# Patient Record
Sex: Female | Born: 1990 | Race: White | Hispanic: No | Smoking: Current every day smoker
Health system: Southern US, Community
[De-identification: ages and names within clinical notes are randomized; demographics above are authoritative.]

## PROBLEM LIST (undated history)

## (undated) DIAGNOSIS — Z8673 Personal history of transient ischemic attack (TIA), and cerebral infarction without residual deficits: Secondary | ICD-10-CM

## (undated) DIAGNOSIS — I10 Essential (primary) hypertension: Secondary | ICD-10-CM

## (undated) DIAGNOSIS — K219 Gastro-esophageal reflux disease without esophagitis: Secondary | ICD-10-CM

## (undated) DIAGNOSIS — K279 Peptic ulcer, site unspecified, unspecified as acute or chronic, without hemorrhage or perforation: Secondary | ICD-10-CM

## (undated) DIAGNOSIS — F32A Depression, unspecified: Secondary | ICD-10-CM

## (undated) DIAGNOSIS — E559 Vitamin D deficiency, unspecified: Secondary | ICD-10-CM

## (undated) DIAGNOSIS — F419 Anxiety disorder, unspecified: Secondary | ICD-10-CM

## (undated) DIAGNOSIS — N390 Urinary tract infection, site not specified: Secondary | ICD-10-CM

## (undated) DIAGNOSIS — E7800 Pure hypercholesterolemia, unspecified: Secondary | ICD-10-CM

## (undated) DIAGNOSIS — G43909 Migraine, unspecified, not intractable, without status migrainosus: Secondary | ICD-10-CM

## (undated) DIAGNOSIS — R7303 Prediabetes: Secondary | ICD-10-CM

## (undated) DIAGNOSIS — F329 Major depressive disorder, single episode, unspecified: Secondary | ICD-10-CM

## (undated) DIAGNOSIS — R002 Palpitations: Secondary | ICD-10-CM

## (undated) DIAGNOSIS — E282 Polycystic ovarian syndrome: Secondary | ICD-10-CM

## (undated) DIAGNOSIS — K259 Gastric ulcer, unspecified as acute or chronic, without hemorrhage or perforation: Secondary | ICD-10-CM

## (undated) HISTORY — DX: Prediabetes: R73.03

## (undated) HISTORY — PX: TUBAL LIGATION: SHX77

## (undated) HISTORY — DX: Palpitations: R00.2

## (undated) HISTORY — PX: CHOLECYSTECTOMY: SHX55

## (undated) HISTORY — DX: Vitamin D deficiency, unspecified: E55.9

## (undated) HISTORY — DX: Urinary tract infection, site not specified: N39.0

## (undated) HISTORY — DX: Personal history of transient ischemic attack (TIA), and cerebral infarction without residual deficits: Z86.73

## (undated) HISTORY — PX: NO PAST SURGERIES: SHX2092

## (undated) HISTORY — DX: Peptic ulcer, site unspecified, unspecified as acute or chronic, without hemorrhage or perforation: K27.9

## (undated) HISTORY — DX: Pure hypercholesterolemia, unspecified: E78.00

## (undated) HISTORY — DX: Depression, unspecified: F32.A

## (undated) HISTORY — DX: Anxiety disorder, unspecified: F41.9

## (undated) HISTORY — PX: BLADDER REPAIR: SHX6721

---
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
## 2010-05-25 LAB — BASIC METABOLIC PANEL
BUN: 7 mg/dL (ref 6–23)
CO2: 30 mEq/L (ref 19–32)
Calcium: 9.1 mg/dL (ref 8.4–10.5)
Chloride: 104 mEq/L (ref 96–112)
Creatinine, Ser: 0.82 mg/dL (ref 0.4–1.2)
GFR calc Af Amer: >60 mL/min (ref >60)
GFR calc non Af Amer: >60 mL/min (ref >60)
Glucose, Bld: 92 mg/dL (ref 70–99)
Potassium: 3.9 mEq/L (ref 3.5–5.1)
Sodium: 142 mEq/L (ref 135–145)

## 2010-05-25 LAB — DIFFERENTIAL
Basophils Absolute: 0 10*3/uL (ref 0.0–0.1)
Basophils Relative: 0 % (ref 0–1)
Eosinophils Absolute: 0 10*3/uL (ref 0.0–0.7)
Eosinophils Relative: 1 % (ref 0–5)
Lymphocytes Relative: 22 % (ref 12–46)
Lymphs Abs: 2.2 10*3/uL (ref 0.7–4.0)
Monocytes Absolute: 0.4 10*3/uL (ref 0.1–1.0)
Monocytes Relative: 4 % (ref 3–12)
Neutro Abs: 7.3 10*3/uL (ref 1.7–7.7)
Neutrophils Relative %: 73 % (ref 43–77)

## 2010-05-25 LAB — URINALYSIS, ROUTINE W REFLEX MICROSCOPIC
Glucose, UA: NEGATIVE mg/dL
Hgb urine dipstick: NEGATIVE
Ketones, ur: 15 mg/dL — AB
Nitrite: NEGATIVE
Protein, ur: 100 mg/dL — AB
Specific Gravity, Urine: 1.04 — ABNORMAL HIGH (ref 1.005–1.030)
Urobilinogen, UA: 1 mg/dL (ref 0.0–1.0)
pH: 7 (ref 5.0–8.0)

## 2010-05-25 LAB — CBC
HCT: 35.1 % — ABNORMAL LOW (ref 36.0–46.0)
Hemoglobin: 11.7 g/dL — ABNORMAL LOW (ref 12.0–15.0)
MCHC: 33.2 g/dL (ref 30.0–36.0)
MCV: 87 fL (ref 78.0–100.0)
Platelets: 225 10*3/uL (ref 150–400)
RBC: 4.04 MIL/uL (ref 3.87–5.11)
RDW: 12.6 % (ref 11.5–15.5)
WBC: 10 10*3/uL (ref 4.0–10.5)

## 2010-05-25 LAB — HEPATIC FUNCTION PANEL
ALT: 40 U/L — ABNORMAL HIGH (ref 0–35)
AST: 72 U/L — ABNORMAL HIGH (ref 0–37)
Albumin: 4 g/dL (ref 3.5–5.2)
Alkaline Phosphatase: 91 U/L (ref 39–117)
Bilirubin, Direct: 0.1 mg/dL (ref 0.0–0.3)
Indirect Bilirubin: 0.5 mg/dL (ref 0.3–0.9)
Total Bilirubin: 0.6 mg/dL (ref 0.3–1.2)
Total Protein: 7 g/dL (ref 6.0–8.3)

## 2010-05-25 LAB — POCT PREGNANCY, URINE: Preg Test, Ur: NEGATIVE

## 2010-05-25 LAB — LIPASE, BLOOD: Lipase: 18 U/L (ref 11–59)

## 2010-05-25 LAB — URINE MICROSCOPIC-ADD ON

## 2010-05-26 LAB — CBC
HCT: 22.2 % — ABNORMAL LOW (ref 36.0–46.0)
HCT: 22.3 % — ABNORMAL LOW (ref 36.0–46.0)
HCT: 24.6 % — ABNORMAL LOW (ref 36.0–46.0)
HCT: 28.1 % — ABNORMAL LOW (ref 36.0–46.0)
HCT: 38.6 % (ref 36.0–46.0)
Hemoglobin: 13.1 g/dL (ref 12.0–15.0)
Hemoglobin: 7.5 g/dL — CL (ref 12.0–15.0)
Hemoglobin: 7.7 g/dL — CL (ref 12.0–15.0)
Hemoglobin: 8.3 g/dL — ABNORMAL LOW (ref 12.0–15.0)
Hemoglobin: 9.7 g/dL — ABNORMAL LOW (ref 12.0–15.0)
MCHC: 33.6 g/dL (ref 30.0–36.0)
MCHC: 33.9 g/dL (ref 30.0–36.0)
MCHC: 34 g/dL (ref 30.0–36.0)
MCHC: 34.3 g/dL (ref 30.0–36.0)
MCHC: 34.5 g/dL (ref 30.0–36.0)
MCV: 91.1 fL (ref 78.0–100.0)
MCV: 91.5 fL (ref 78.0–100.0)
MCV: 91.5 fL (ref 78.0–100.0)
MCV: 91.6 fL (ref 78.0–100.0)
MCV: 92.3 fL (ref 78.0–100.0)
Platelets: 145 10*3/uL — ABNORMAL LOW (ref 150–400)
Platelets: 157 10*3/uL (ref 150–400)
Platelets: 161 10*3/uL (ref 150–400)
Platelets: 178 10*3/uL (ref 150–400)
Platelets: 204 10*3/uL (ref 150–400)
RBC: 2.43 MIL/uL — ABNORMAL LOW (ref 3.87–5.11)
RBC: 2.44 MIL/uL — ABNORMAL LOW (ref 3.87–5.11)
RBC: 2.69 MIL/uL — ABNORMAL LOW (ref 3.87–5.11)
RBC: 3.05 MIL/uL — ABNORMAL LOW (ref 3.87–5.11)
RBC: 4.23 MIL/uL (ref 3.87–5.11)
RDW: 12.9 % (ref 11.5–15.5)
RDW: 13.2 % (ref 11.5–15.5)
RDW: 13.3 % (ref 11.5–15.5)
RDW: 13.3 % (ref 11.5–15.5)
RDW: 13.4 % (ref 11.5–15.5)
WBC: 10.2 10*3/uL (ref 4.0–10.5)
WBC: 10.8 10*3/uL — ABNORMAL HIGH (ref 4.0–10.5)
WBC: 18 10*3/uL — ABNORMAL HIGH (ref 4.0–10.5)
WBC: 25.5 10*3/uL — ABNORMAL HIGH (ref 4.0–10.5)
WBC: 9.2 10*3/uL (ref 4.0–10.5)

## 2010-05-26 LAB — COMPREHENSIVE METABOLIC PANEL
ALT: 24 U/L (ref 0–35)
ALT: 27 U/L (ref 0–35)
ALT: 32 U/L (ref 0–35)
ALT: 42 U/L — ABNORMAL HIGH (ref 0–35)
AST: 28 U/L (ref 0–37)
AST: 32 U/L (ref 0–37)
AST: 53 U/L — ABNORMAL HIGH (ref 0–37)
AST: 75 U/L — ABNORMAL HIGH (ref 0–37)
Albumin: 2.2 g/dL — ABNORMAL LOW (ref 3.5–5.2)
Albumin: 2.3 g/dL — ABNORMAL LOW (ref 3.5–5.2)
Albumin: 2.4 g/dL — ABNORMAL LOW (ref 3.5–5.2)
Albumin: 3.2 g/dL — ABNORMAL LOW (ref 3.5–5.2)
Alkaline Phosphatase: 115 U/L (ref 39–117)
Alkaline Phosphatase: 121 U/L — ABNORMAL HIGH (ref 39–117)
Alkaline Phosphatase: 144 U/L — ABNORMAL HIGH (ref 39–117)
Alkaline Phosphatase: 167 U/L — ABNORMAL HIGH (ref 39–117)
BUN: 2 mg/dL — ABNORMAL LOW (ref 6–23)
BUN: 3 mg/dL — ABNORMAL LOW (ref 6–23)
BUN: 3 mg/dL — ABNORMAL LOW (ref 6–23)
BUN: 8 mg/dL (ref 6–23)
CO2: 21 mEq/L (ref 19–32)
CO2: 27 mEq/L (ref 19–32)
CO2: 27 mEq/L (ref 19–32)
CO2: 27 mEq/L (ref 19–32)
Calcium: 6.2 mg/dL — CL (ref 8.4–10.5)
Calcium: 6.5 mg/dL — ABNORMAL LOW (ref 8.4–10.5)
Calcium: 7.6 mg/dL — ABNORMAL LOW (ref 8.4–10.5)
Calcium: 8.7 mg/dL (ref 8.4–10.5)
Chloride: 103 mEq/L (ref 96–112)
Chloride: 103 mEq/L (ref 96–112)
Chloride: 105 mEq/L (ref 96–112)
Chloride: 107 mEq/L (ref 96–112)
Creatinine, Ser: 0.66 mg/dL (ref 0.4–1.2)
Creatinine, Ser: 0.67 mg/dL (ref 0.4–1.2)
Creatinine, Ser: 0.67 mg/dL (ref 0.4–1.2)
Creatinine, Ser: 0.7 mg/dL (ref 0.4–1.2)
GFR calc Af Amer: >60 mL/min (ref >60)
GFR calc Af Amer: >60 mL/min (ref >60)
GFR calc Af Amer: >60 mL/min (ref >60)
GFR calc Af Amer: >60 mL/min (ref >60)
GFR calc non Af Amer: >60 mL/min (ref >60)
GFR calc non Af Amer: >60 mL/min (ref >60)
GFR calc non Af Amer: >60 mL/min (ref >60)
GFR calc non Af Amer: >60 mL/min (ref >60)
Glucose, Bld: 75 mg/dL (ref 70–99)
Glucose, Bld: 79 mg/dL (ref 70–99)
Glucose, Bld: 86 mg/dL (ref 70–99)
Glucose, Bld: 99 mg/dL (ref 70–99)
Potassium: 3.7 mEq/L (ref 3.5–5.1)
Potassium: 3.7 mEq/L (ref 3.5–5.1)
Potassium: 3.8 mEq/L (ref 3.5–5.1)
Potassium: 4.1 mEq/L (ref 3.5–5.1)
Sodium: 136 mEq/L (ref 135–145)
Sodium: 136 mEq/L (ref 135–145)
Sodium: 137 mEq/L (ref 135–145)
Sodium: 139 mEq/L (ref 135–145)
Total Bilirubin: 0.3 mg/dL (ref 0.3–1.2)
Total Bilirubin: 0.4 mg/dL (ref 0.3–1.2)
Total Bilirubin: 0.4 mg/dL (ref 0.3–1.2)
Total Bilirubin: 0.6 mg/dL (ref 0.3–1.2)
Total Protein: 4.6 g/dL — ABNORMAL LOW (ref 6.0–8.3)
Total Protein: 5.2 g/dL — ABNORMAL LOW (ref 6.0–8.3)
Total Protein: 5.3 g/dL — ABNORMAL LOW (ref 6.0–8.3)
Total Protein: 6.7 g/dL (ref 6.0–8.3)

## 2010-05-26 LAB — MAGNESIUM: Magnesium: 5.2 mg/dL — ABNORMAL HIGH (ref 1.5–2.5)

## 2010-05-26 LAB — URINALYSIS, ROUTINE W REFLEX MICROSCOPIC
Bilirubin Urine: NEGATIVE
Glucose, UA: NEGATIVE mg/dL
Hgb urine dipstick: NEGATIVE
Ketones, ur: NEGATIVE mg/dL
Nitrite: NEGATIVE
Protein, ur: NEGATIVE mg/dL
Specific Gravity, Urine: 1.025 (ref 1.005–1.030)
Urobilinogen, UA: 0.2 mg/dL (ref 0.0–1.0)
pH: 5.5 (ref 5.0–8.0)

## 2010-05-26 LAB — URIC ACID
Uric Acid, Serum: 5 mg/dL (ref 2.4–7.0)
Uric Acid, Serum: 5.4 mg/dL (ref 2.4–7.0)

## 2010-05-26 LAB — RPR: RPR Ser Ql: NONREACTIVE

## 2010-05-26 LAB — LACTATE DEHYDROGENASE: LDH: 249 U/L (ref 94–250)

## 2010-05-27 LAB — URINE CULTURE: Colony Count: 65000

## 2010-05-27 LAB — COMPREHENSIVE METABOLIC PANEL
ALT: 35 U/L (ref 0–35)
AST: 30 U/L (ref 0–37)
Albumin: 3 g/dL — ABNORMAL LOW (ref 3.5–5.2)
Alkaline Phosphatase: 141 U/L — ABNORMAL HIGH (ref 39–117)
BUN: 8 mg/dL (ref 6–23)
CO2: 24 mEq/L (ref 19–32)
Calcium: 8.5 mg/dL (ref 8.4–10.5)
Chloride: 104 mEq/L (ref 96–112)
Creatinine, Ser: 0.73 mg/dL (ref 0.4–1.2)
GFR calc Af Amer: >60 mL/min (ref >60)
GFR calc non Af Amer: >60 mL/min (ref >60)
Glucose, Bld: 87 mg/dL (ref 70–99)
Potassium: 3.9 mEq/L (ref 3.5–5.1)
Sodium: 137 mEq/L (ref 135–145)
Total Bilirubin: 0.4 mg/dL (ref 0.3–1.2)
Total Protein: 6.3 g/dL (ref 6.0–8.3)

## 2010-05-27 LAB — URINALYSIS, ROUTINE W REFLEX MICROSCOPIC
Bilirubin Urine: NEGATIVE
Glucose, UA: NEGATIVE mg/dL
Ketones, ur: NEGATIVE mg/dL
Nitrite: NEGATIVE
Protein, ur: NEGATIVE mg/dL
Specific Gravity, Urine: 1.025 (ref 1.005–1.030)
Urobilinogen, UA: 0.2 mg/dL (ref 0.0–1.0)
pH: 5.5 (ref 5.0–8.0)

## 2010-05-27 LAB — CBC
HCT: 35.9 % — ABNORMAL LOW (ref 36.0–46.0)
HCT: 38.6 % (ref 36.0–46.0)
Hemoglobin: 12.1 g/dL (ref 12.0–15.0)
Hemoglobin: 13 g/dL (ref 12.0–15.0)
MCHC: 33.6 g/dL (ref 30.0–36.0)
MCHC: 33.8 g/dL (ref 30.0–36.0)
MCV: 90.9 fL (ref 78.0–100.0)
MCV: 91.3 fL (ref 78.0–100.0)
Platelets: 189 10*3/uL (ref 150–400)
Platelets: 213 10*3/uL (ref 150–400)
RBC: 3.94 MIL/uL (ref 3.87–5.11)
RBC: 4.25 MIL/uL (ref 3.87–5.11)
RDW: 12.8 % (ref 11.5–15.5)
RDW: 13 % (ref 11.5–15.5)
WBC: 13.3 10*3/uL — ABNORMAL HIGH (ref 4.0–10.5)
WBC: 16.9 10*3/uL — ABNORMAL HIGH (ref 4.0–10.5)

## 2010-05-27 LAB — URINE MICROSCOPIC-ADD ON: RBC / HPF: NONE SEEN RBC/hpf (ref <3)

## 2010-05-27 LAB — RPR: RPR Ser Ql: NONREACTIVE

## 2010-08-14 ENCOUNTER — Emergency Department (HOSPITAL_COMMUNITY)
Admission: EM | Admit: 2010-08-14 | Discharge: 2010-08-14 | Disposition: A | Discharge status: Home / Self Care | Payer: Self-pay | Attending: Emergency Medicine | Admitting: Emergency Medicine

## 2010-08-14 DIAGNOSIS — F410 Panic disorder [episodic paroxysmal anxiety] without agoraphobia: Secondary | ICD-10-CM | POA: Insufficient documentation

## 2010-08-14 DIAGNOSIS — R064 Hyperventilation: Secondary | ICD-10-CM | POA: Insufficient documentation

## 2010-11-15 LAB — POCT PREGNANCY, URINE
Operator id: 173591
Preg Test, Ur: NEGATIVE

## 2010-11-15 LAB — URINE MICROSCOPIC-ADD ON

## 2010-11-15 LAB — URINALYSIS, ROUTINE W REFLEX MICROSCOPIC
Glucose, UA: NEGATIVE
Ketones, ur: 15 — AB
Nitrite: POSITIVE — AB
Protein, ur: 100 — AB
Specific Gravity, Urine: 1.03
Urobilinogen, UA: 1
pH: 5.5

## 2012-07-01 ENCOUNTER — Other Ambulatory Visit: Payer: Self-pay | Admitting: Nurse Practitioner

## 2012-07-28 ENCOUNTER — Other Ambulatory Visit: Payer: Self-pay | Admitting: Family Medicine

## 2012-07-28 DIAGNOSIS — N912 Amenorrhea, unspecified: Secondary | ICD-10-CM

## 2012-07-30 ENCOUNTER — Ambulatory Visit
Admission: RE | Admit: 2012-07-30 | Discharge: 2012-07-30 | Disposition: A | Discharge status: Home / Self Care | Payer: BC Managed Care – PPO | Source: Ambulatory Visit | Attending: Family Medicine | Admitting: Family Medicine

## 2012-07-30 DIAGNOSIS — N912 Amenorrhea, unspecified: Secondary | ICD-10-CM

## 2012-10-04 ENCOUNTER — Emergency Department (HOSPITAL_COMMUNITY)
Admission: EM | Admit: 2012-10-04 | Discharge: 2012-10-04 | Disposition: A | Discharge status: Home / Self Care | Payer: BC Managed Care – PPO | Attending: Emergency Medicine | Admitting: Emergency Medicine

## 2012-10-04 ENCOUNTER — Encounter (HOSPITAL_COMMUNITY): Payer: Self-pay | Admitting: *Deleted

## 2012-10-04 ENCOUNTER — Emergency Department (HOSPITAL_COMMUNITY): Payer: BC Managed Care – PPO

## 2012-10-04 DIAGNOSIS — K8020 Calculus of gallbladder without cholecystitis without obstruction: Secondary | ICD-10-CM | POA: Insufficient documentation

## 2012-10-04 DIAGNOSIS — Z79899 Other long term (current) drug therapy: Secondary | ICD-10-CM | POA: Insufficient documentation

## 2012-10-04 DIAGNOSIS — Z3202 Encounter for pregnancy test, result negative: Secondary | ICD-10-CM | POA: Insufficient documentation

## 2012-10-04 DIAGNOSIS — N12 Tubulo-interstitial nephritis, not specified as acute or chronic: Secondary | ICD-10-CM | POA: Insufficient documentation

## 2012-10-04 LAB — URINALYSIS, ROUTINE W REFLEX MICROSCOPIC
Bilirubin Urine: NEGATIVE
Glucose, UA: NEGATIVE mg/dL
Ketones, ur: 15 mg/dL — AB
Nitrite: POSITIVE — AB
Protein, ur: 30 mg/dL — AB
Specific Gravity, Urine: 1.011 (ref 1.005–1.030)
Urobilinogen, UA: 0.2 mg/dL (ref 0.0–1.0)
pH: 6 (ref 5.0–8.0)

## 2012-10-04 LAB — URINE MICROSCOPIC-ADD ON

## 2012-10-04 LAB — CBC WITH DIFFERENTIAL/PLATELET
Basophils Absolute: 0 10*3/uL (ref 0.0–0.1)
Basophils Relative: 0 % (ref 0–1)
Eosinophils Absolute: 0.1 10*3/uL (ref 0.0–0.7)
Eosinophils Relative: 1 % (ref 0–5)
HCT: 32.4 % — ABNORMAL LOW (ref 36.0–46.0)
Hemoglobin: 10.8 g/dL — ABNORMAL LOW (ref 12.0–15.0)
Lymphocytes Relative: 15 % (ref 12–46)
Lymphs Abs: 1.7 10*3/uL (ref 0.7–4.0)
MCH: 27.6 pg (ref 26.0–34.0)
MCHC: 33.3 g/dL (ref 30.0–36.0)
MCV: 82.9 fL (ref 78.0–100.0)
Monocytes Absolute: 1 10*3/uL (ref 0.1–1.0)
Monocytes Relative: 9 % (ref 3–12)
Neutro Abs: 8.3 10*3/uL — ABNORMAL HIGH (ref 1.7–7.7)
Neutrophils Relative %: 75 % (ref 43–77)
Platelets: 252 10*3/uL (ref 150–400)
RBC: 3.91 MIL/uL (ref 3.87–5.11)
RDW: 13.8 % (ref 11.5–15.5)
WBC: 11.1 10*3/uL — ABNORMAL HIGH (ref 4.0–10.5)

## 2012-10-04 LAB — COMPREHENSIVE METABOLIC PANEL
ALT: 13 U/L (ref 0–35)
AST: 13 U/L (ref 0–37)
Albumin: 3.1 g/dL — ABNORMAL LOW (ref 3.5–5.2)
Alkaline Phosphatase: 66 U/L (ref 39–117)
BUN: 8 mg/dL (ref 6–23)
CO2: 25 mEq/L (ref 19–32)
Calcium: 8.5 mg/dL (ref 8.4–10.5)
Chloride: 102 mEq/L (ref 96–112)
Creatinine, Ser: 0.76 mg/dL (ref 0.50–1.10)
GFR calc Af Amer: >90 mL/min (ref >90)
GFR calc non Af Amer: >90 mL/min (ref >90)
Glucose, Bld: 135 mg/dL — ABNORMAL HIGH (ref 70–99)
Potassium: 2.8 mEq/L — ABNORMAL LOW (ref 3.5–5.1)
Sodium: 138 mEq/L (ref 135–145)
Total Bilirubin: 0.3 mg/dL (ref 0.3–1.2)
Total Protein: 6.8 g/dL (ref 6.0–8.3)

## 2012-10-04 LAB — LIPASE, BLOOD: Lipase: 16 U/L (ref 11–59)

## 2012-10-04 LAB — POCT PREGNANCY, URINE
Preg Test, Ur: NEGATIVE
Preg Test, Ur: NEGATIVE

## 2012-10-04 MED ORDER — ONDANSETRON 4 MG PO TBDP
8.0000 mg | ORAL_TABLET | Freq: Once | ORAL | Status: AC
  Administered 2012-10-04: 8 mg via ORAL

## 2012-10-04 MED ORDER — ONDANSETRON 4 MG PO TBDP
ORAL_TABLET | ORAL | Status: AC
  Filled 2012-10-04: qty 2

## 2012-10-04 MED ORDER — CIPROFLOXACIN HCL 500 MG PO TABS: 500.0000 mg | ORAL_TABLET | Freq: Two times a day (BID) | ORAL | Status: DC

## 2012-10-04 MED ORDER — POTASSIUM CHLORIDE 20 MEQ/15ML (10%) PO LIQD
40.0000 meq | Freq: Once | ORAL | Status: AC
  Administered 2012-10-04: 40 meq via ORAL
  Filled 2012-10-04: qty 30

## 2012-10-04 MED ORDER — SODIUM CHLORIDE 0.9 % IV BOLUS (SEPSIS)
1000.0000 mL | Freq: Once | INTRAVENOUS | Status: AC
  Administered 2012-10-04: 1000 mL via INTRAVENOUS

## 2012-10-04 NOTE — ED Provider Notes (Signed)
CSN: [id number]     Arrival date & time 10/04/12  1455 History     First MD Initiated Contact with Patient 10/04/12 1524     Chief Complaint  Patient presents with  . Abdominal Pain   (Consider location/radiation/quality/duration/timing/severity/associated sxs/prior Treatment) Patient is a 22 y.o. female presenting with abdominal pain. The history is provided by the patient.  Abdominal Pain Pain location:  RUQ Pain quality: sharp   Pain quality: not burning, not cramping and no pressure   Pain radiates to:  R flank Pain severity:  Moderate Onset quality:  Gradual Duration:  7 days Timing:  Constant Progression:  Unchanged Chronicity:  New Context: not alcohol use   Relieved by:  NSAIDs Associated symptoms: dysuria, fever, nausea and vomiting   Associated symptoms: no chest pain, no diarrhea and no shortness of breath    This is a 22 year old female with no significant past medical history who presents with right-sided abdominal pain. The patient also reports a fever to 103. She describes the pain as sharp and radiating to her right flank. It is constant.  There is no association with food. Patient has never had pain like this before. The patient does endorse urinary frequency and dysuria. She states her last period was last week. She endorses decreased by mouth. Currently her pain is 3/10. History reviewed. No pertinent past medical history. History reviewed. No pertinent past surgical history. History reviewed. No pertinent family history. History  Substance Use Topics  . Smoking status: Never Smoker   . Smokeless tobacco: Not on file  . Alcohol Use: No   OB History   Grav Para Term Preterm Abortions TAB SAB Ect Mult Living                 Review of Systems  Constitutional: Positive for fever.  HENT: Negative.   Respiratory: Negative for chest tightness and shortness of breath.   Cardiovascular: Negative for chest pain.  Gastrointestinal: Positive for nausea, vomiting  and abdominal pain. Negative for diarrhea.  Genitourinary: Positive for dysuria and urgency.  Musculoskeletal: Positive for back pain.  Skin: Negative for rash.  Neurological: Positive for light-headedness. Negative for headaches.  Psychiatric/Behavioral: Negative.   All other systems reviewed and are negative.    Allergies  Review of patient's allergies indicates no known allergies.  Home Medications   Current Outpatient Rx  Name  Route  Sig  Dispense  Refill  . Calcium Carbonate Antacid (TUMS PO)   Oral   Take 1 tablet by mouth daily as needed (for acid reflux).         . ferrous sulfate 325 (65 FE) MG tablet   Oral   Take 325 mg by mouth daily with breakfast.         . LORazepam (ATIVAN) 0.5 MG tablet   Oral   Take 0.5 mg by mouth 2 (two) times daily as needed for anxiety.         . norelgestromin-ethinyl estradiol (ORTHO EVRA) 150-35 MCG/24HR transdermal patch   Transdermal   Place 1 patch onto the skin once a week.         Marland Kitchen omeprazole (PRILOSEC) 20 MG capsule   Oral   Take 20 mg by mouth daily.         . promethazine (PHENERGAN) 25 MG tablet   Oral   Take 25 mg by mouth every 6 (six) hours as needed for nausea.         Marland Kitchen venlafaxine XR (EFFEXOR-XR) 75  MG 24 hr capsule   Oral   Take 75 mg by mouth daily.         . ciprofloxacin (CIPRO) 500 MG tablet   Oral   Take 1 tablet (500 mg total) by mouth every 12 (twelve) hours.   28 tablet   0    BP 147/81  Pulse 110  Temp(Src) 100.3 F (37.9 C) (Oral)  Resp 18  SpO2 97%  LMP 09/27/2012 Physical Exam  Nursing note and vitals reviewed. Constitutional: She is oriented to person, place, and time. She appears well-developed and well-nourished.  HENT:  Head: Normocephalic and atraumatic.  Eyes: Pupils are equal, round, and reactive to light.  Neck: Neck supple.  Cardiovascular: Normal rate, regular rhythm and normal heart sounds.   Pulmonary/Chest: Effort normal. No respiratory distress. She  has no wheezes.  Abdominal: Soft. Bowel sounds are normal. There is tenderness in the right upper quadrant. There is positive Murphy's sign. There is no rebound and no guarding.  Neurological: She is alert and oriented to person, place, and time.  Skin: Skin is warm and dry.  Psychiatric: She has a normal mood and affect.    ED Course   Medications  ondansetron (ZOFRAN-ODT) disintegrating tablet 8 mg (8 mg Oral Given 10/04/12 1521)  sodium chloride 0.9 % bolus 1,000 mL (0 mL Intravenous Stopped 10/04/12 1819)  potassium chloride 20 MEQ/15ML (10%) liquid 40 mEq (40 mEq Oral Given 10/04/12 1833)    Procedures (including critical care time)  Labs Reviewed  COMPREHENSIVE METABOLIC PANEL - Abnormal; Notable for the following:    Potassium 2.8 (*)    Glucose, Bld 135 (*)    Albumin 3.1 (*)    All other components within normal limits  CBC WITH DIFFERENTIAL - Abnormal; Notable for the following:    WBC 11.1 (*)    Hemoglobin 10.8 (*)    HCT 32.4 (*)    Neutro Abs 8.3 (*)    All other components within normal limits  URINALYSIS, ROUTINE W REFLEX MICROSCOPIC - Abnormal; Notable for the following:    APPearance CLOUDY (*)    Hgb urine dipstick MODERATE (*)    Ketones, ur 15 (*)    Protein, ur 30 (*)    Nitrite POSITIVE (*)    Leukocytes, UA MODERATE (*)    All other components within normal limits  URINE MICROSCOPIC-ADD ON - Abnormal; Notable for the following:    Squamous Epithelial / LPF MANY (*)    Bacteria, UA MANY (*)    All other components within normal limits  URINE CULTURE  LIPASE, BLOOD  POCT PREGNANCY, URINE  POCT PREGNANCY, URINE   US Abdomen Complete  10/04/2012   *RADIOLOGY REPORT*  Abdominal ultrasound  History:  Abdominal pain and fever  Comparison:  November 30, 2008  Findings:  Gallbladder is visualized in multiple projections. Within the gallbladder, there are multiple echogenic foci which move and shadow consistent with gallstones.  Largest individual gallstone  measures 5 mm in length.  There is no gallbladder wall thickening or pericholecystic fluid collection.  There is no intrahepatic, common hepatic, or common bile duct dilatation.  The pancreas appears normal.  No focal liver lesions are identified.  Spleen is normal in size and homogeneous in echotexture.  Kidneys bilaterally appear normal. There is no ascites.  Aorta is nonaneurysmal.  Inferior vena cava appears normal.  Conclusion:  Cholelithiasis.  Study otherwise unremarkable.   Original Report Authenticated By: Bretta Bang, M.D.   1. Pyelonephritis  2. Gallstones     MDM  This is a 22 year old female who presents with complaints of right upper quadrant and right flank pain. She is nontoxic-appearing on exam. She was noted in triage to have a temperature 100.3 a pulse rate of 110. On exam she appears mildly dehydrated and has a positive Murphy sign.  Differential includes cholecystitis, pyelonephritis, hepatitis. Lab work is notable for mildly elevated white count of the left and 0.1. Patient's urine is nitrite positive with moderate leuks. Culture was sent. Patient was given IV Rocephin. A right upper quadrant ultrasound was obtained to rule out cholecystitis. Right upper quadrant ultrasound shows cholelithiasis without evidence of cholecystitis. The patient's symptoms are most likely related to the patient's UTI. Her presentation with fevers and flank pain are more consistent with pyelonephritis versus cystitis. The patient is able to tolerate by mouth and is generally well-appearing on exam.  I think she is appropriate for a trial of outpatient management on ciprofloxacin. She has a primary care physician and will followup with him on Monday. She was instructed to return if she has worsening fevers, worsening flank pain, or any other concerning symptoms.  Shon Baton, MD 10/04/12 2215

## 2012-10-04 NOTE — ED Notes (Signed)
Advised pt if she is unable to give urine sample, in and out cath will be necessary.  Pt given water to drink.

## 2012-10-04 NOTE — ED Notes (Signed)
Patient states that when she urinates it burns at times, feels like she is not finished and then in a short time she feels like she has to go again.  This has been going on for a few days.

## 2012-10-04 NOTE — ED Notes (Signed)
Advised pt of the need for urine sample.

## 2012-10-04 NOTE — ED Notes (Signed)
Pt reports right side pain x 1 week, having n/v, fever, burning pain with urination and now vomiting blood. Denies any diarrhea.

## 2012-10-04 NOTE — ED Notes (Signed)
Advised pt of the need for urine sample. 

## 2012-10-07 LAB — URINE CULTURE: Colony Count: >=100000

## 2012-10-09 NOTE — ED Notes (Signed)
+   Urine Culture- treated with appropriate medication per protocol MD. 

## 2012-11-22 ENCOUNTER — Encounter (HOSPITAL_COMMUNITY): Payer: Self-pay | Admitting: *Deleted

## 2012-11-22 ENCOUNTER — Emergency Department (HOSPITAL_COMMUNITY): Payer: BC Managed Care – PPO

## 2012-11-22 ENCOUNTER — Emergency Department (HOSPITAL_COMMUNITY)
Admission: EM | Admit: 2012-11-22 | Discharge: 2012-11-22 | Disposition: A | Discharge status: Home / Self Care | Payer: BC Managed Care – PPO | Attending: Emergency Medicine | Admitting: Emergency Medicine

## 2012-11-22 DIAGNOSIS — F411 Generalized anxiety disorder: Secondary | ICD-10-CM | POA: Insufficient documentation

## 2012-11-22 DIAGNOSIS — M549 Dorsalgia, unspecified: Secondary | ICD-10-CM

## 2012-11-22 DIAGNOSIS — Y939 Activity, unspecified: Secondary | ICD-10-CM | POA: Insufficient documentation

## 2012-11-22 DIAGNOSIS — Z3202 Encounter for pregnancy test, result negative: Secondary | ICD-10-CM | POA: Insufficient documentation

## 2012-11-22 DIAGNOSIS — Z79899 Other long term (current) drug therapy: Secondary | ICD-10-CM | POA: Insufficient documentation

## 2012-11-22 DIAGNOSIS — M542 Cervicalgia: Secondary | ICD-10-CM

## 2012-11-22 DIAGNOSIS — IMO0002 Reserved for concepts with insufficient information to code with codable children: Secondary | ICD-10-CM | POA: Insufficient documentation

## 2012-11-22 DIAGNOSIS — S0993XA Unspecified injury of face, initial encounter: Secondary | ICD-10-CM | POA: Insufficient documentation

## 2012-11-22 DIAGNOSIS — Y92410 Unspecified street and highway as the place of occurrence of the external cause: Secondary | ICD-10-CM | POA: Insufficient documentation

## 2012-11-22 LAB — URINALYSIS, ROUTINE W REFLEX MICROSCOPIC
Bilirubin Urine: NEGATIVE
Glucose, UA: NEGATIVE mg/dL
Hgb urine dipstick: NEGATIVE
Ketones, ur: 15 mg/dL — AB
Nitrite: NEGATIVE
Protein, ur: 30 mg/dL — AB
Specific Gravity, Urine: 1.025 (ref 1.005–1.030)
Urobilinogen, UA: 0.2 mg/dL (ref 0.0–1.0)
pH: 6 (ref 5.0–8.0)

## 2012-11-22 LAB — PREGNANCY, URINE: Preg Test, Ur: NEGATIVE

## 2012-11-22 LAB — URINE MICROSCOPIC-ADD ON

## 2012-11-22 MED ORDER — METHOCARBAMOL 500 MG PO TABS: 500.0000 mg | ORAL_TABLET | Freq: Two times a day (BID) | ORAL | Status: DC

## 2012-11-22 MED ORDER — NAPROXEN 500 MG PO TABS: 500.0000 mg | ORAL_TABLET | Freq: Two times a day (BID) | ORAL | Status: DC

## 2012-11-22 MED ORDER — METOCLOPRAMIDE HCL 5 MG/ML IJ SOLN
10.0000 mg | Freq: Once | INTRAMUSCULAR | Status: AC
  Administered 2012-11-22: 10 mg via INTRAMUSCULAR
  Filled 2012-11-22: qty 2

## 2012-11-22 MED ORDER — DIPHENHYDRAMINE HCL 50 MG/ML IJ SOLN
25.0000 mg | Freq: Once | INTRAMUSCULAR | Status: AC
  Administered 2012-11-22: 25 mg via INTRAMUSCULAR
  Filled 2012-11-22: qty 1
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
## 2012-11-23 NOTE — ED Provider Notes (Signed)
Medical screening examination/treatment/procedure(s) were performed by non-physician practitioner and as supervising physician I was immediately available for consultation/collaboration.   Malick Netz M Taygen Newsome, MD 11/23/12 1238 

## 2014-04-18 ENCOUNTER — Emergency Department (HOSPITAL_COMMUNITY)
Admission: EM | Admit: 2014-04-18 | Discharge: 2014-04-19 | Disposition: A | Discharge status: Home / Self Care | Payer: MEDICAID | Attending: Emergency Medicine | Admitting: Emergency Medicine

## 2014-04-18 ENCOUNTER — Encounter (HOSPITAL_COMMUNITY): Payer: Self-pay | Admitting: *Deleted

## 2014-04-18 ENCOUNTER — Emergency Department (HOSPITAL_COMMUNITY): Payer: MEDICAID

## 2014-04-18 DIAGNOSIS — R61 Generalized hyperhidrosis: Secondary | ICD-10-CM | POA: Insufficient documentation

## 2014-04-18 DIAGNOSIS — F419 Anxiety disorder, unspecified: Secondary | ICD-10-CM | POA: Insufficient documentation

## 2014-04-18 DIAGNOSIS — F410 Panic disorder [episodic paroxysmal anxiety] without agoraphobia: Secondary | ICD-10-CM

## 2014-04-18 DIAGNOSIS — Z3202 Encounter for pregnancy test, result negative: Secondary | ICD-10-CM | POA: Insufficient documentation

## 2014-04-18 DIAGNOSIS — R0789 Other chest pain: Secondary | ICD-10-CM | POA: Diagnosis not present

## 2014-04-18 DIAGNOSIS — Z91040 Latex allergy status: Secondary | ICD-10-CM | POA: Insufficient documentation

## 2014-04-18 DIAGNOSIS — Z720 Tobacco use: Secondary | ICD-10-CM | POA: Insufficient documentation

## 2014-04-18 DIAGNOSIS — R064 Hyperventilation: Secondary | ICD-10-CM | POA: Diagnosis not present

## 2014-04-18 DIAGNOSIS — R0602 Shortness of breath: Secondary | ICD-10-CM | POA: Diagnosis present

## 2014-04-18 HISTORY — DX: Anxiety disorder, unspecified: F41.9

## 2014-04-18 LAB — BASIC METABOLIC PANEL
Anion gap: 8 (ref 5–15)
BUN: 9 mg/dL (ref 6–23)
CO2: 31 mmol/L (ref 19–32)
Calcium: 9.3 mg/dL (ref 8.4–10.5)
Chloride: 100 mmol/L (ref 96–112)
Creatinine, Ser: 0.82 mg/dL (ref 0.50–1.10)
GFR calc Af Amer: >90 mL/min (ref >90)
GFR calc non Af Amer: >90 mL/min (ref >90)
Glucose, Bld: 91 mg/dL (ref 70–99)
Potassium: 3.2 mmol/L — ABNORMAL LOW (ref 3.5–5.1)
Sodium: 139 mmol/L (ref 135–145)

## 2014-04-18 LAB — CBC WITH DIFFERENTIAL/PLATELET
Basophils Absolute: 0 10*3/uL (ref 0.0–0.1)
Basophils Relative: 0 % (ref 0–1)
Eosinophils Absolute: 0.1 10*3/uL (ref 0.0–0.7)
Eosinophils Relative: 1 % (ref 0–5)
HCT: 42.8 % (ref 36.0–46.0)
Hemoglobin: 14.2 g/dL (ref 12.0–15.0)
Lymphocytes Relative: 43 % (ref 12–46)
Lymphs Abs: 4.9 10*3/uL — ABNORMAL HIGH (ref 0.7–4.0)
MCH: 27.8 pg (ref 26.0–34.0)
MCHC: 33.2 g/dL (ref 30.0–36.0)
MCV: 83.8 fL (ref 78.0–100.0)
Monocytes Absolute: 0.5 10*3/uL (ref 0.1–1.0)
Monocytes Relative: 5 % (ref 3–12)
Neutro Abs: 5.8 10*3/uL (ref 1.7–7.7)
Neutrophils Relative %: 51 % (ref 43–77)
Platelets: 267 10*3/uL (ref 150–400)
RBC: 5.11 MIL/uL (ref 3.87–5.11)
RDW: 13.8 % (ref 11.5–15.5)
WBC: 11.4 10*3/uL — ABNORMAL HIGH (ref 4.0–10.5)

## 2014-04-18 MED ORDER — KETOROLAC TROMETHAMINE 30 MG/ML IJ SOLN
30.0000 mg | Freq: Once | INTRAMUSCULAR | Status: AC
  Administered 2014-04-18: 30 mg via INTRAVENOUS
  Filled 2014-04-18: qty 1

## 2014-04-18 NOTE — ED Notes (Signed)
MD Glick at bedside. 

## 2014-04-18 NOTE — ED Provider Notes (Signed)
CSN: [id number]     Arrival date & time 04/18/14  2114 History  This chart was scribed for Betty Boozeavid Darragh Nay, MD by Evon Slackerrance Branch, ED Scribe. This patient was seen in room A12C/A12C and the patient's care was started at 11:15 PM.      Chief Complaint  Patient presents with  . Shortness of Breath   Patient is a 24 y.o. female presenting with shortness of breath. The history is provided by the patient. No language interpreter was used.  Shortness of Breath Associated symptoms: chest pain and diaphoresis   Associated symptoms: no fever    HPI Comments: Vic RipperRebecca A Dean is a 24 y.o. female with PMHx of anxiety who presents to the Emergency Department complaining of squeezing chest pain onset tonight at 7 pm while driving. Pt rates the severity of her pain 7/10. Pt states she had associated SOB, diaphoresis and left arm pain that has resolved since being in the ED. Pt states that shortly after onset "things" began to go numb. Pt states she took ativan PTA which provided no relief. Pt states that breathing makes her chest pain worse.  Denies nausea or other related symptoms. Pt states she is a current 1 ppd smoker. Pt denies use of any birth controls. LMP mid January.   PCP [facility]   Past Medical History  Diagnosis Date  . Anxious mood    No past surgical history on file. No family history on file. History  Substance Use Topics  . Smoking status: Current Every Day Smoker -- 1.00 packs/day    Types: Cigarettes  . Smokeless tobacco: Not on file  . Alcohol Use: No   OB History    No data available     Review of Systems  Constitutional: Positive for diaphoresis. Negative for fever.  Respiratory: Positive for shortness of breath.   Cardiovascular: Positive for chest pain.  Gastrointestinal: Negative for nausea.  Musculoskeletal: Positive for arthralgias.  All other systems reviewed and are negative.     Allergies  Shrimp and Latex  Home Medications   Prior to  Admission medications   Medication Sig Start Date End Date Taking? Authorizing Provider  Calcium Carbonate Antacid (TUMS PO) Take 2 tablets by mouth as needed (for acid reflux).    Yes Historical Provider, MD  escitalopram (LEXAPRO) 10 MG tablet Take 10 mg by mouth at bedtime. 04/06/14  Yes Historical Provider, MD  ferrous sulfate 325 (65 FE) MG tablet Take 325 mg by mouth at bedtime.    Yes Historical Provider, MD  ibuprofen (ADVIL,MOTRIN) 200 MG tablet Take 400-600 mg by mouth every 6 (six) hours as needed (pain).    Yes Historical Provider, MD  LORazepam (ATIVAN) 0.5 MG tablet Take 0.5 mg by mouth 2 (two) times daily as needed for anxiety.   Yes Historical Provider, MD  Prenatal Vit-Fe Fumarate-FA (PRENATAL MULTIVITAMIN) TABS tablet Take 1 tablet by mouth at bedtime.   Yes Historical Provider, MD  promethazine (PHENERGAN) 25 MG tablet Take 25 mg by mouth every 6 (six) hours as needed for nausea.   Yes Historical Provider, MD  SUMAtriptan (IMITREX) 100 MG tablet Take 100 mg by mouth every 2 (two) hours as needed for migraine.  04/06/14  Yes Historical Provider, MD  valACYclovir (VALTREX) 500 MG tablet Take 500 mg by mouth at bedtime.  04/06/14  Yes Historical Provider, MD  methocarbamol (ROBAXIN) 500 MG tablet Take 1 tablet (500 mg total) by mouth 2 (two) times daily. Patient not taking: Reported  on 04/18/2014 11/22/12   Marissa Sciacca, PA-C  naproxen (NAPROSYN) 500 MG tablet Take 1 tablet (500 mg total) by mouth 2 (two) times daily. Patient not taking: Reported on 04/18/2014 11/22/12   Marissa Sciacca, PA-C   BP 114/66 mmHg  Pulse 69  Temp(Src) 98.2 F (36.8 C)  Resp 15  Ht  (1.676 m)  Wt 200 lb (90.719 kg)  BMI 32.30 kg/m2  SpO2 91%  LMP 03/18/2014   Physical Exam  Constitutional: She is oriented to person, place, and time. She appears well-developed and well-nourished. No distress.  Appear anxious.   HENT:  Head: Normocephalic and atraumatic.  Eyes: Conjunctivae and EOM are  normal. Pupils are equal, round, and reactive to light.  Neck: Normal range of motion. Neck supple. No JVD present.  Cardiovascular: Normal rate, regular rhythm and normal heart sounds.   No murmur heard. Pulmonary/Chest: Effort normal and breath sounds normal. She has no wheezes. She has no rales. She exhibits tenderness.  Mild bilateral parasternal tenderness.   Abdominal: Soft. Bowel sounds are normal. She exhibits no distension and no mass. There is no tenderness.  Musculoskeletal: Normal range of motion. She exhibits no edema.  Lymphadenopathy:    She has no cervical adenopathy.  Neurological: She is alert and oriented to person, place, and time. No cranial nerve deficit. Coordination normal.  Skin: Skin is warm and dry. No rash noted.  Psychiatric: She has a normal mood and affect. Her behavior is normal.  Nursing note and vitals reviewed.   ED Course  Procedures (including critical care time) DIAGNOSTIC STUDIES: Oxygen Saturation is 100% on RA, normal by my interpretation.    COORDINATION OF CARE: 11:24 PM-Discussed treatment plan with pt at bedside and pt agreed to plan.     Labs Review Results for orders placed or performed during the hospital encounter of 04/18/14  Basic metabolic panel  Result Value Ref Range   Sodium 139 135 - 145 mmol/L   Potassium 3.2 (L) 3.5 - 5.1 mmol/L   Chloride 100 96 - 112 mmol/L   CO2 31 19 - 32 mmol/L   Glucose, Bld 91 70 - 99 mg/dL   BUN 9 6 - 23 mg/dL   Creatinine, Ser 1.61 0.50 - 1.10 mg/dL   Calcium 9.3 8.4 - 09.6 mg/dL   GFR calc non Af Amer >90 >90 mL/min   GFR calc Af Amer >90 >90 mL/min   Anion gap 8 5 - 15  CBC with Differential  Result Value Ref Range   WBC 11.4 (H) 4.0 - 10.5 K/uL   RBC 5.11 3.87 - 5.11 MIL/uL   Hemoglobin 14.2 12.0 - 15.0 g/dL   HCT 04.5 40.9 - 81.1 %   MCV 83.8 78.0 - 100.0 fL   MCH 27.8 26.0 - 34.0 pg   MCHC 33.2 30.0 - 36.0 g/dL   RDW 91.4 78.2 - 95.6 %   Platelets 267 150 - 400 K/uL    Neutrophils Relative % 51 43 - 77 %   Neutro Abs 5.8 1.7 - 7.7 K/uL   Lymphocytes Relative 43 12 - 46 %   Lymphs Abs 4.9 (H) 0.7 - 4.0 K/uL   Monocytes Relative 5 3 - 12 %   Monocytes Absolute 0.5 0.1 - 1.0 K/uL   Eosinophils Relative 1 0 - 5 %   Eosinophils Absolute 0.1 0.0 - 0.7 K/uL   Basophils Relative 0 0 - 1 %   Basophils Absolute 0.0 0.0 - 0.1 K/uL  Pregnancy, urine  Result Value Ref Range   Preg Test, Ur NEGATIVE NEGATIVE  Urinalysis, Routine w reflex microscopic  Result Value Ref Range   Color, Urine YELLOW YELLOW   APPearance TURBID (A) CLEAR   Specific Gravity, Urine 1.016 1.005 - 1.030   pH 7.0 5.0 - 8.0   Glucose, UA NEGATIVE NEGATIVE mg/dL   Hgb urine dipstick NEGATIVE NEGATIVE   Bilirubin Urine NEGATIVE NEGATIVE   Ketones, ur NEGATIVE NEGATIVE mg/dL   Protein, ur NEGATIVE NEGATIVE mg/dL   Urobilinogen, UA 0.2 0.0 - 1.0 mg/dL   Nitrite NEGATIVE NEGATIVE   Leukocytes, UA NEGATIVE NEGATIVE  Urine microscopic-add on  Result Value Ref Range   Squamous Epithelial / LPF RARE RARE   Bacteria, UA MANY (A) RARE   Casts HYALINE CASTS (A) NEGATIVE   Urine-Other AMORPHOUS URATES/PHOSPHATES    Dg Chest 2 View  04/18/2014   CLINICAL DATA:  Acute onset of shortness of breath. Initial encounter.  EXAM: CHEST  2 VIEW  COMPARISON:  Chest radiograph performed 10/15/2008  FINDINGS: The lungs are well-aerated and clear. There is no evidence of focal opacification, pleural effusion or pneumothorax.  The heart is normal in size; the mediastinal contour is within normal limits. No acute osseous abnormalities are seen.  IMPRESSION: No acute cardiopulmonary process seen.   Electronically Signed   By: Roanna Raider M.D.   On: 04/18/2014 22:14     Imaging Review Dg Chest 2 View  04/18/2014   CLINICAL DATA:  Acute onset of shortness of breath. Initial encounter.  EXAM: CHEST  2 VIEW  COMPARISON:  Chest radiograph performed 10/15/2008  FINDINGS: The lungs are well-aerated and clear. There  is no evidence of focal opacification, pleural effusion or pneumothorax.  The heart is normal in size; the mediastinal contour is within normal limits. No acute osseous abnormalities are seen.  IMPRESSION: No acute cardiopulmonary process seen.   Electronically Signed   By: Roanna Raider M.D.   On: 04/18/2014 22:14     EKG Interpretation   Date/Time:  Sunday April 18 2014 21:17:16 EST Ventricular Rate:  88 PR Interval:  150 QRS Duration: 84 QT Interval:  374 QTC Calculation: 452 R Axis:   80 Text Interpretation:  Normal sinus rhythm Nonspecific T wave abnormality  Abnormal ECG No old tracing to compare Confirmed by [facility]  MD, Nahiara Kretzschmar  ([id number]) on 04/18/2014 11:15:15 PM      MDM   Final diagnoses:  Anxiety attack  Hyperventilation  Chest wall pain      Panic attack with hyperventilation. Chest pain which appears to be chest wall pain. She is given a dose of ketorolac in the ED with excellent relief of pain. On reevaluation, she is no longer anxious and heart rate and respiratory rate are normal. She is reassured of benign nature of her symptoms and advised to use over-the-counter NSAIDs as needed for chest pain in the future. Referred back to her PCP for follow-up.   I personally performed the services described in this documentation, which was scribed in my presence. The recorded information has been reviewed and is accurate.       Betty Booze, MD 04/19/14 [phone]

## 2014-04-18 NOTE — ED Notes (Signed)
The pt is c.o chest pain  And sob for  2 hours.  She is also c/o bi-lateral arm numbness f.  anxiious hyperventilating at present. Cautioned to slow her rerspirations.  She is tearful in triage.   Shallow  And rapid respirations.   She took her ativan approx 1 hr ago.  lmp jan

## 2014-04-18 NOTE — ED Notes (Signed)
Pt is aware urine is needed for testing, pt is unable to urinate at this time.  

## 2014-04-19 LAB — URINALYSIS, ROUTINE W REFLEX MICROSCOPIC
Bilirubin Urine: NEGATIVE
Glucose, UA: NEGATIVE mg/dL
Hgb urine dipstick: NEGATIVE
Ketones, ur: NEGATIVE mg/dL
Leukocytes, UA: NEGATIVE
Nitrite: NEGATIVE
Protein, ur: NEGATIVE mg/dL
Specific Gravity, Urine: 1.016 (ref 1.005–1.030)
Urobilinogen, UA: 0.2 mg/dL (ref 0.0–1.0)
pH: 7 (ref 5.0–8.0)

## 2014-04-19 LAB — URINE MICROSCOPIC-ADD ON

## 2014-04-19 LAB — PREGNANCY, URINE: Preg Test, Ur: NEGATIVE

## 2014-04-19 NOTE — Discharge Instructions (Signed)
Take ibuprofen or naproxen as needed for pain.   Chest Wall Pain Chest wall pain is pain in or around the bones and muscles of your chest. It may take up to 6 weeks to get better. It may take longer if you must stay physically active in your work and activities.  CAUSES  Chest wall pain may happen on its own. However, it may be caused by:  A viral illness like the flu.  Injury.  Coughing.  Exercise.  Arthritis.  Fibromyalgia.  Shingles. HOME CARE INSTRUCTIONS   Avoid overtiring physical activity. Try not to strain or perform activities that cause pain. This includes any activities using your chest or your abdominal and side muscles, especially if heavy weights are used.  Put ice on the sore area.  Put ice in a plastic bag.  Place a towel between your skin and the bag.  Leave the ice on for 15-20 minutes per hour while awake for the first 2 days.  Only take over-the-counter or prescription medicines for pain, discomfort, or fever as directed by your caregiver. SEEK IMMEDIATE MEDICAL CARE IF:   Your pain increases, or you are very uncomfortable.  You have a fever.  Your chest pain becomes worse.  You have new, unexplained symptoms.  You have nausea or vomiting.  You feel sweaty or lightheaded.  You have a cough with phlegm (sputum), or you cough up blood. MAKE SURE YOU:   Understand these instructions.  Will watch your condition.  Will get help right away if you are not doing well or get worse. Document Released: 02/05/2005 Document Revised: 04/30/2011 Document Reviewed: 10/02/2010 [facility] Patient Information 2015 [place], [place]. This information is not intended to replace advice given to you by your health care provider. Make sure you discuss any questions you have with your health care provider.  Hyperventilation Hyperventilation is breathing that is deeper and more rapid than normal. It is usually associated with panic and anxiety. Hyperventilation can  make you feel breathless. It is sometimes called overbreathing. Breathing out too much causes a decrease in the amount of carbon dioxide gas in the blood. This leads to tingling and numbness in the hands, feet, and around the mouth. If this continues, your fingers, hands, and toes may begin to spasm. Hyperventilation usually lasts 20-30 minutes and can be associated with other symptoms of panic and anxiety, including:   Chest pains or tightness.  A pounding or irregular, racing heartbeat (palpitations).  Dizziness.  Lightheadedness.  Dry mouth.  Weakness.  Confusion.  Sleep disturbance. CAUSES  Sudden onset (acute) hyperventilation is usually triggered by acute stress, anxiety, or emotional upset. Long-term (chronic) and recurring hyperventilation can occur with chronic lung problems, such emphysema or asthma. Other causes include:   Nervousness.  Stress.  Stimulant, drug, or alcohol use.  Lung disease.  Infections, such as pneumonia.  Heart problems.  Severe pain.  Waking from a bad dream.  Pregnancy.  Bleeding. HOME CARE INSTRUCTIONS  Learn and use breathing exercises that help you breathe from your diaphragm and abdomen.  Practice relaxation techniques to reduce stress, such as visualization, meditation, and muscle release.  During an attack, try breathing into a paper bag. This changes the carbon dioxide level and slows down breathing. SEEK IMMEDIATE MEDICAL CARE IF:  Your hyperventilation continues or gets worse. MAKE SURE YOU:  Understand these instructions.  Will watch your condition.  Will get help right away if you are not doing well or get worse. Document Released: 02/03/2000 Document Revised:  08/07/2011 Document Reviewed: 05/17/2011 Exit[employer] Patient Information 2015 [place], [place]. This information is not intended to replace advice given to you by your health care provider. Make sure you discuss any questions you have with your health care  provider.  Panic Attacks Panic attacks are sudden, short-livedsurges of severe anxiety, fear, or discomfort. They may occur for no reason when you are relaxed, when you are anxious, or when you are sleeping. Panic attacks may occur for a number of reasons:   Healthy people occasionally have panic attacks in extreme, life-threatening situations, such as war or natural disasters. Normal anxiety is a protective mechanism of the body that helps [place] react to danger (fight or flight response).  Panic attacks are often seen with anxiety disorders, such as panic disorder, social anxiety disorder, generalized anxiety disorder, and phobias. Anxiety disorders cause excessive or uncontrollable anxiety. They may interfere with your relationships or other life activities.  Panic attacks are sometimes seen with other mental illnesses, such as depression and posttraumatic stress disorder.  Certain medical conditions, prescription medicines, and drugs of abuse can cause panic attacks. SYMPTOMS  Panic attacks start suddenly, peak within 20 minutes, and are accompanied by four or more of the following symptoms:  Pounding heart or fast heart rate (palpitations).  Sweating.  Trembling or shaking.  Shortness of breath or feeling smothered.  Feeling choked.  Chest pain or discomfort.  Nausea or strange feeling in your stomach.  Dizziness, light-headedness, or feeling like you will faint.  Chills or hot flushes.  Numbness or tingling in your lips or hands and feet.  Feeling that things are not real or feeling that you are not yourself.  Fear of losing control or going crazy.  Fear of dying. Some of these symptoms can mimic serious medical conditions. For example, you may think you are having a heart attack. Although panic attacks can be very scary, they are not life threatening. DIAGNOSIS  Panic attacks are diagnosed through an assessment by your health care provider. Your health care provider will  ask questions about your symptoms, such as where and when they occurred. Your health care provider will also ask about your medical history and use of alcohol and drugs, including prescription medicines. Your health care provider may order blood tests or other studies to rule out a serious medical condition. Your health care provider may refer you to a mental health professional for further evaluation. TREATMENT   Most healthy people who have one or two panic attacks in an extreme, life-threatening situation will not require treatment.  The treatment for panic attacks associated with anxiety disorders or other mental illness typically involves counseling with a mental health professional, medicine, or a combination of both. Your health care provider will help determine what treatment is best for you.  Panic attacks due to physical illness usually go away with treatment of the illness. If prescription medicine is causing panic attacks, talk with your health care provider about stopping the medicine, decreasing the dose, or substituting another medicine.  Panic attacks due to alcohol or drug abuse go away with abstinence. Some adults need professional help in order to stop drinking or using drugs. HOME CARE INSTRUCTIONS   Take all medicines as directed by your health care provider.   Schedule and attend follow-up visits as directed by your health care provider. It is important to keep all your appointments. SEEK MEDICAL CARE IF:  You are not able to take your medicines as prescribed.  Your symptoms do not improve  or get worse. SEEK IMMEDIATE MEDICAL CARE IF:   You experience panic attack symptoms that are different than your usual symptoms.  You have serious thoughts about hurting yourself or others.  You are taking medicine for panic attacks and have a serious side effect. MAKE SURE YOU:  Understand these instructions.  Will watch your condition.  Will get help right away if you are  not doing well or get worse. Document Released: 02/05/2005 Document Revised: 02/10/2013 Document Reviewed: 09/19/2012 [facility] Patient Information 2015 [place], [place]. This information is not intended to replace advice given to you by your health care provider. Make sure you discuss any questions you have with your health care provider.

## 2014-05-17 ENCOUNTER — Ambulatory Visit (INDEPENDENT_AMBULATORY_CARE_PROVIDER_SITE_OTHER): Payer: Medicaid Other | Admitting: Diagnostic Neuroimaging

## 2014-05-17 ENCOUNTER — Encounter: Payer: Self-pay | Admitting: Diagnostic Neuroimaging

## 2014-05-17 VITALS — BP 138/75 | HR 80 | Ht 65.0 in | Wt 220.0 lb

## 2014-05-17 DIAGNOSIS — G43109 Migraine with aura, not intractable, without status migrainosus: Secondary | ICD-10-CM | POA: Diagnosis not present

## 2014-05-17 MED ORDER — TOPIRAMATE 50 MG PO TABS: 50.0000 mg | ORAL_TABLET | Freq: Two times a day (BID) | ORAL | Status: DC

## 2014-05-17 MED ORDER — RIZATRIPTAN BENZOATE 10 MG PO TBDP: 10.0000 mg | ORAL_TABLET | ORAL | Status: DC | PRN

## 2014-05-17 NOTE — Progress Notes (Addendum)
[facility]  PATIENT: Betty Dean DOB: 05/17/1990  REFERRING CLINICIAN: Kelby FamF Wilson HISTORY FROM: patient (accompanied by husband and daughter) REASON FOR VISIT: new consult   HISTORICAL  CHIEF COMPLAINT:  Chief Complaint  Patient presents with  . New Evaluation    migraines     HISTORY OF PRESENT ILLNESS:   24 year old right-handed female here for evaluation of migraine headaches. Consult requested by Dr. Andrey CampanileWilson. Patient has had headaches since age 89 years old. She describes bilateral, severe throbbing, aching headaches associated with blurred vision, nausea, photophobia and phonophobia. Patient has some scintillating scotoma, black splotches and dots with severe headaches. She has headaches 4-5 times per week. Headaches can last hours or days at a time. No specific triggering factors except for lack of sleep.  Patient is tried Imitrex recently which caused hallucinations and slurred speech after the second dose. She has never been on migraine preventive medication. Patient has been using Tylenol 500 mg, 5-9 tablets every other day for headache management.  Family history of migraine in patient's mother, aunt and grandmother.  Patient has seen by optometrist last time 2 years ago, and currently wears contact lenses.   REVIEW OF SYSTEMS: Full 14 system review of systems performed and notable only for memory loss headache numbness dizziness insomnia sleepiness passing out depression anxiety decr energy fatigue constipation blurred vision double vision eye pain easy bruising.   ALLERGIES: Allergies  Allergen Reactions  . Shrimp [Shellfish Allergy] Itching    Rash / Swelling  . Latex Itching and Rash    HOME MEDICATIONS: Outpatient Prescriptions Prior to Visit  Medication Sig Dispense Refill  . escitalopram (LEXAPRO) 10 MG tablet Take 10 mg by mouth at bedtime.  11  . ferrous sulfate 325 (65 FE) MG tablet Take 325 mg by mouth at bedtime.     Marland Kitchen.  LORazepam (ATIVAN) 0.5 MG tablet Take 0.5 mg by mouth 2 (two) times daily as needed for anxiety.    . valACYclovir (VALTREX) 500 MG tablet Take 500 mg by mouth at bedtime.   11  . Calcium Carbonate Antacid (TUMS PO) Take 2 tablets by mouth as needed (for acid reflux).     Marland Kitchen. ibuprofen (ADVIL,MOTRIN) 200 MG tablet Take 400-600 mg by mouth every 6 (six) hours as needed (pain).     . methocarbamol (ROBAXIN) 500 MG tablet Take 1 tablet (500 mg total) by mouth 2 (two) times daily. (Patient not taking: Reported on 04/18/2014) 20 tablet 0  . naproxen (NAPROSYN) 500 MG tablet Take 1 tablet (500 mg total) by mouth 2 (two) times daily. (Patient not taking: Reported on 04/18/2014) 30 tablet 0  . Prenatal Vit-Fe Fumarate-FA (PRENATAL MULTIVITAMIN) TABS tablet Take 1 tablet by mouth at bedtime.    . promethazine (PHENERGAN) 25 MG tablet Take 25 mg by mouth every 6 (six) hours as needed for nausea.    . SUMAtriptan (IMITREX) 100 MG tablet Take 100 mg by mouth every 2 (two) hours as needed for migraine.   11   No facility-administered medications prior to visit.    PAST MEDICAL HISTORY: Past Medical History  Diagnosis Date  . Anxious mood     PAST SURGICAL HISTORY: History reviewed. No pertinent past surgical history.  FAMILY HISTORY: Family History  Problem Relation Age of Onset  . Migraines Mother   . Migraines Maternal Aunt   . Multiple sclerosis Maternal Grandmother   . Migraines Maternal Grandmother     SOCIAL HISTORY:  History   Social History  .  Marital Status: Married    Spouse Name: N/A  . Number of Children: N/A  . Years of Education: N/A   Occupational History  . Not on file.   Social History Main Topics  . Smoking status: Current Every Day Smoker -- 1.00 packs/day    Types: Cigarettes  . Smokeless tobacco: Not on file  . Alcohol Use: No  . Drug Use: No  . Sexual Activity: Yes    Birth Control/ Protection: Patch   Other Topics Concern  . Not on file   Social History  Narrative     PHYSICAL EXAM  Filed Vitals:   05/17/14 0905  BP: 138/75  Pulse: 80  Height:  (1.651 m)  Weight: 220 lb (99.791 kg)    Body mass index is 36.61 kg/(m^2).   Visual Acuity Screening   Right eye Left eye Both eyes  Without correction: 20/50 20/70   With correction:       No flowsheet data found.  GENERAL EXAM: Patient is in no distress; well developed, nourished and groomed; neck is supple; MILD FACIAL HIRSUITISM; MOON FACIES.  CARDIOVASCULAR: Regular rate and rhythm, no murmurs, no carotid bruits  NEUROLOGIC: MENTAL STATUS: awake, alert, oriented to person, place and time, recent and remote memory intact, normal attention and concentration, language fluent, comprehension intact, naming intact, fund of knowledge appropriate CRANIAL NERVE: no papilledema on fundoscopic exam, pupils equal and reactive to light, visual fields full to confrontation, extraocular muscles intact, no nystagmus, facial sensation and strength symmetric, hearing intact, palate elevates symmetrically, uvula midline, shoulder shrug symmetric, tongue midline. MOTOR: normal bulk and tone, full strength in the BUE, BLE SENSORY: normal and symmetric to light touch, pinprick, temperature, vibration  COORDINATION: finger-nose-finger, fine finger movements normal REFLEXES: deep tendon reflexes present and symmetric GAIT/STATION: narrow based gait; able to walk on toes, heels and tandem; romberg is negative    DIAGNOSTIC DATA (LABS, IMAGING, TESTING) - I reviewed patient records, labs, notes, testing and imaging myself where available.  Lab Results  Component Value Date   WBC 11.4* 04/18/2014   HGB 14.2 04/18/2014   HCT 42.8 04/18/2014   MCV 83.8 04/18/2014   PLT 267 04/18/2014      Component Value Date/Time   NA 139 04/18/2014 2324   K 3.2* 04/18/2014 2324   CL 100 04/18/2014 2324   CO2 31 04/18/2014 2324   GLUCOSE 91 04/18/2014 2324   BUN 9 04/18/2014 2324   CREATININE 0.82  04/18/2014 2324   CALCIUM 9.3 04/18/2014 2324   PROT 6.8 10/04/2012 1515   ALBUMIN 3.1* 10/04/2012 1515   AST 13 10/04/2012 1515   ALT 13 10/04/2012 1515   ALKPHOS 66 10/04/2012 1515   BILITOT 0.3 10/04/2012 1515   GFRNONAA >90 04/18/2014 2324   GFRAA >90 04/18/2014 2324   No results found for: CHOL, HDL, LDLCALC, LDLDIRECT, TRIG, CHOLHDL No results found for: ZOXW9U No results found for: VITAMINB12 No results found for: TSH    11/22/12 CT CERVICAL SPINE - No acute bony abnormality noted. Straightening of the normal cervical lordosis likely related to muscular spasm is seen. [I reviewed images myself and agree with interpretation. -VRP]      ASSESSMENT AND PLAN  24 y.o. year old female here with migraine with AURA since age 16 years old. 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 Neurologic examination is normal. We'll try medication management with topiramate and rizatriptan.  Dx: Migraine with aura and without status migrainosus, not intractable  PLAN: - TPX + rizatriptan prn - headache education, headache diary and medication side effects reviewed - avoid excessive tylenol usage  Meds ordered this encounter  Medications  . topiramate (TOPAMAX) 50 MG tablet    Sig: Take 1 tablet (50 mg total) by mouth 2 (two) times daily.    Dispense:  60 tablet    Refill:  12  . rizatriptan (MAXALT-MLT) 10 MG disintegrating tablet    Sig: Take 1 tablet (10 mg total) by mouth as needed for migraine. May repeat in 2 hours if needed    Dispense:  9 tablet    Refill:  11   Return in about 6 weeks (around 06/28/2014).    Suanne Marker, MD 05/17/2014, 9:42 AM Certified in Neurology, Neurophysiology and Neuroimaging  [facility] [address] [place], [place] [address] [phone]

## 2014-05-17 NOTE — Patient Instructions (Signed)
Try topiramate 50mg  at bedtime x 1 week, then increase to twice a day.  Use rizatriptan as needed for breakthrough migraine.

## 2014-06-24 ENCOUNTER — Emergency Department (HOSPITAL_BASED_OUTPATIENT_CLINIC_OR_DEPARTMENT_OTHER): Payer: Medicaid Other

## 2014-06-24 ENCOUNTER — Encounter (HOSPITAL_BASED_OUTPATIENT_CLINIC_OR_DEPARTMENT_OTHER): Payer: Self-pay | Admitting: *Deleted

## 2014-06-24 ENCOUNTER — Emergency Department (HOSPITAL_BASED_OUTPATIENT_CLINIC_OR_DEPARTMENT_OTHER)
Admission: EM | Admit: 2014-06-24 | Discharge: 2014-06-24 | Disposition: A | Discharge status: Home / Self Care | Payer: Medicaid Other | Attending: Emergency Medicine | Admitting: Emergency Medicine

## 2014-06-24 DIAGNOSIS — W108XXA Fall (on) (from) other stairs and steps, initial encounter: Secondary | ICD-10-CM | POA: Insufficient documentation

## 2014-06-24 DIAGNOSIS — Z720 Tobacco use: Secondary | ICD-10-CM | POA: Diagnosis not present

## 2014-06-24 DIAGNOSIS — Y9301 Activity, walking, marching and hiking: Secondary | ICD-10-CM | POA: Insufficient documentation

## 2014-06-24 DIAGNOSIS — S92902A Unspecified fracture of left foot, initial encounter for closed fracture: Secondary | ICD-10-CM

## 2014-06-24 DIAGNOSIS — Z79899 Other long term (current) drug therapy: Secondary | ICD-10-CM | POA: Diagnosis not present

## 2014-06-24 DIAGNOSIS — Y998 Other external cause status: Secondary | ICD-10-CM | POA: Insufficient documentation

## 2014-06-24 DIAGNOSIS — Y9289 Other specified places as the place of occurrence of the external cause: Secondary | ICD-10-CM | POA: Insufficient documentation

## 2014-06-24 DIAGNOSIS — Z91040 Latex allergy status: Secondary | ICD-10-CM | POA: Insufficient documentation

## 2014-06-24 DIAGNOSIS — F419 Anxiety disorder, unspecified: Secondary | ICD-10-CM | POA: Diagnosis not present

## 2014-06-24 DIAGNOSIS — S99922A Unspecified injury of left foot, initial encounter: Secondary | ICD-10-CM | POA: Diagnosis present

## 2014-06-24 MED ORDER — IBUPROFEN 800 MG PO TABS
800.0000 mg | ORAL_TABLET | Freq: Once | ORAL | Status: AC
  Administered 2014-06-24: 800 mg via ORAL
  Filled 2014-06-24: qty 1

## 2014-06-24 MED ORDER — HYDROCODONE-ACETAMINOPHEN 7.5-325 MG/15ML PO SOLN
10.0000 mL | Freq: Once | ORAL | Status: AC
  Administered 2014-06-24: 10 mL via ORAL
  Filled 2014-06-24: qty 15

## 2014-06-24 MED ORDER — IBUPROFEN 800 MG PO TABS: 800.0000 mg | ORAL_TABLET | Freq: Three times a day (TID) | ORAL | Status: DC

## 2014-06-24 MED ORDER — HYDROCODONE-ACETAMINOPHEN 5-325 MG PO TABS: 1.0000 | ORAL_TABLET | ORAL | Status: DC | PRN

## 2014-06-24 NOTE — ED Notes (Signed)
EMT splinting leg at this time, pt tolerating well, [facility] pt NAD, calm.

## 2014-06-24 NOTE — ED Notes (Signed)
Crutch Training and splint placed by FR EMT

## 2014-06-24 NOTE — ED Provider Notes (Signed)
CSN: [id number]     Arrival date & time 06/24/14  1903 History   First MD Initiated Contact with Patient 06/24/14 2009     Chief Complaint  Patient presents with  . Fall  . Foot Injury     (Consider location/radiation/quality/duration/timing/severity/associated sxs/prior Treatment) HPI   PCP: KAPLAN,KRISTEN, PA-C Blood pressure 143/94, pulse 100, temperature 98.7 F (37.1 C), temperature source Oral, resp. rate 20, height 5\' 5"  (1.651 m), weight 220 lb (99.791 kg), SpO2 100 %.  Vic RipperRebecca A Ausmus is a 24 y.o.female with a significant PMH of anxiety presents to the ER with complaints of  Left ankle pain. She was walking down the stairs when she miss stepped causing her left ankle to twist.  He denies syncope, loss of consciousness, head or neck injury. She denies any other injuries. The incident happened this evening and she is now having significant swelling and pain to that left foot. She endorses some bruising that denies numbness or weakness.  Patient is unable to bear weight due to pain.   Past Medical History  Diagnosis Date  . Anxious mood    History reviewed. No pertinent past surgical history. Family History  Problem Relation Age of Onset  . Migraines Mother   . Migraines Maternal Aunt   . Multiple sclerosis Maternal Grandmother   . Migraines Maternal Grandmother    History  Substance Use Topics  . Smoking status: Current Every Day Smoker -- 1.00 packs/day    Types: Cigarettes  . Smokeless tobacco: Not on file  . Alcohol Use: No   OB History    No data available     Review of Systems   ROS: No TIA's or unusual headaches, no dysphagia.  No prolonged cough. No dyspnea or chest pain on exertion.  No abdominal pain, change in bowel habits, black or bloody stools.  No urinary tract symptoms.  Normal menses, no abnormal vaginal bleeding, discharge or unexpected pelvic pain. No new breast lumps, breast pain or nipple discharge.  Allergies  Shrimp and Latex  Home  Medications   Prior to Admission medications   Medication Sig Start Date End Date Taking? Authorizing Provider  acetaminophen (TYLENOL) 325 MG tablet Take 650 mg by mouth every 6 (six) hours as needed.    Historical Provider, MD  Calcium Carbonate Antacid (TUMS PO) Take 2 tablets by mouth as needed (for acid reflux).     Historical Provider, MD  Diphenhydramine-APAP, sleep, (GOODYS PM PO) Take 1 Package by mouth.    Historical Provider, MD  escitalopram (LEXAPRO) 10 MG tablet Take 10 mg by mouth at bedtime. 04/06/14   Historical Provider, MD  ferrous sulfate 325 (65 FE) MG tablet Take 325 mg by mouth at bedtime.     Historical Provider, MD  HYDROcodone-acetaminophen (NORCO/VICODIN) 5-325 MG per tablet Take 1 tablet by mouth every 4 (four) hours as needed. 06/24/14   Jevaun Strick Neva SeatGreene, PA-C  ibuprofen (ADVIL,MOTRIN) 800 MG tablet Take 1 tablet (800 mg total) by mouth 3 (three) times daily. 06/24/14   Charae Depaolis Neva SeatGreene, PA-C  LORazepam (ATIVAN) 0.5 MG tablet Take 0.5 mg by mouth 2 (two) times daily as needed for anxiety.    Historical Provider, MD  Multiple Vitamin (MULTIVITAMIN WITH MINERALS) TABS tablet Take 1 tablet by mouth daily.    Historical Provider, MD  rizatriptan (MAXALT-MLT) 10 MG disintegrating tablet Take 1 tablet (10 mg total) by mouth as needed for migraine. May repeat in 2 hours if needed 05/17/14   Suanne MarkerVikram R Penumalli, MD  topiramate (TOPAMAX) 50 MG tablet Take 1 tablet (50 mg total) by mouth 2 (two) times daily. 05/17/14   Suanne MarkerVikram R Penumalli, MD  valACYclovir (VALTREX) 500 MG tablet Take 500 mg by mouth at bedtime.  04/06/14   Historical Provider, MD   BP 143/94 mmHg  Pulse 100  Temp(Src) 98.7 F (37.1 C) (Oral)  Resp 20  Ht 5\' 5"  (1.651 m)  Wt 220 lb (99.791 kg)  BMI 36.61 kg/m2  SpO2 100% Physical Exam  Constitutional: She appears well-developed and well-nourished. No distress.  HENT:  Head: Normocephalic and atraumatic.  Eyes: Pupils are equal, round, and reactive to light.  Neck:  Normal range of motion. Neck supple.  Cardiovascular: Normal rate and regular rhythm.   Pulmonary/Chest: Effort normal.  Abdominal: Soft.  Musculoskeletal:       Left ankle: She exhibits decreased range of motion, swelling and ecchymosis. She exhibits no deformity, no laceration and normal pulse. Tenderness. Lateral malleolus and posterior TFL tenderness found. No medial malleolus, no AITFL, no CF ligament, no head of 5th metatarsal and no proximal fibula tenderness found. Achilles tendon normal.  NIV, CR < 3 seconds per each toe  Neurological: She is alert.  Skin: Skin is warm and dry.  Nursing note and vitals reviewed.     ED Course  Procedures (including critical care time) Labs Review Labs Reviewed - No data to display  Imaging Review Dg Foot Complete Left  06/24/2014   CLINICAL DATA:  Left lateral foot pain at the fifth metatarsal after fall today. Initial encounter.  EXAM: LEFT FOOT - COMPLETE 3+ VIEW  COMPARISON:  None.  FINDINGS: There is a lucency through the dorsal talar neck which could reflect an avulsion fracture. No confirmatory soft tissue swelling in this region.  Prominent calcaneus anterior process, question coalition with the navicular.  IMPRESSION: 1. Suspect dorsal talar neck avulsion fracture. Correlate focal tenderness. 2. 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   Electronically Signed   By: Marnee SpringJonathon  Watts M.D.   On: 06/24/2014 20:03     EKG Interpretation None      MDM   Final diagnoses:  Foot fracture, left, closed, initial encounter     posterior short leg splint, crutches, NSAIDs, pain medication, referral to Ortho. Patients tenderness to her foot is worse near area on xray and lateral midfoot. Will treat as fracture until ortho is able to see her and evaluate. NIV  23 y.o.Prudencio PairRebecca A Duross's evaluation in the Emergency Department is complete. It has been determined that no acute conditions requiring further emergency intervention are present at this time.  The patient/guardian have been advised of the diagnosis and plan. We have discussed signs and symptoms that warrant return to the ED, such as changes or worsening in symptoms.  Vital signs are stable at discharge. Filed Vitals:   06/24/14 1911  BP: 143/94  Pulse: 100  Temp: 98.7 F (37.1 C)  Resp: 20    Patient/guardian has voiced understanding and agreed to follow-up with the PCP or specialist.     Marlon Peliffany Anara Cowman, PA-C 06/24/14 2049  Vanetta MuldersScott Zackowski, MD 06/25/14 1355

## 2014-06-24 NOTE — ED Notes (Signed)
Alert, NAD, calm, interactive, [facility], c/o pain with some numbness and tingling, xray resulted, no meds PTA.

## 2014-06-24 NOTE — ED Notes (Signed)
Left foot injury. She fell on the stairs tonight.  Swelling noted.

## 2014-06-24 NOTE — Discharge Instructions (Signed)
Cast or Splint Care °Casts and splints support injured limbs and keep bones from moving while they heal. It is important to care for your cast or splint at home.   °HOME CARE INSTRUCTIONS °· Keep the cast or splint uncovered during the drying period. It can take 24 to 48 hours to dry if it is made of plaster. A fiberglass cast will dry in less than 1 hour. °· Do not rest the cast on anything harder than a pillow for the first 24 hours. °· Do not put weight on your injured limb or apply pressure to the cast until your health care provider gives you permission. °· Keep the cast or splint dry. Wet casts or splints can lose their shape and may not support the limb as well. A wet cast that has lost its shape can also create harmful pressure on your skin when it dries. Also, wet skin can become infected. °· Cover the cast or splint with a plastic bag when bathing or when out in the rain or snow. If the cast is on the trunk of the body, take sponge baths until the cast is removed. °· If your cast does become wet, dry it with a towel or a blow dryer on the cool setting only. °· Keep your cast or splint clean. Soiled casts may be wiped with a moistened cloth. °· Do not place any hard or soft foreign objects under your cast or splint, such as cotton, toilet paper, lotion, or powder. °· Do not try to scratch the skin under the cast with any object. The object could get stuck inside the cast. Also, scratching could lead to an infection. If itching is a problem, use a blow dryer on a cool setting to relieve discomfort. °· Do not trim or cut your cast or remove padding from inside of it. °· Exercise all joints next to the injury that are not immobilized by the cast or splint. For example, if you have a long leg cast, exercise the hip joint and toes. If you have an arm cast or splint, exercise the shoulder, elbow, thumb, and fingers. °· Elevate your injured arm or leg on 1 or 2 pillows for the first 1 to 3 days to decrease  swelling and pain. It is best if you can comfortably elevate your cast so it is higher than your heart. °SEEK MEDICAL CARE IF:  °· Your cast or splint cracks. °· Your cast or splint is too tight or too loose. °· You have unbearable itching inside the cast. °· Your cast becomes wet or develops a soft spot or area. °· You have a bad smell coming from inside your cast. °· You get an object stuck under your cast. °· Your skin around the cast becomes red or raw. °· You have new pain or worsening pain after the cast has been applied. °SEEK IMMEDIATE MEDICAL CARE IF:  °· You have fluid leaking through the cast. °· You are unable to move your fingers or toes. °· You have discolored (blue or white), cool, painful, or very swollen fingers or toes beyond the cast. °· You have tingling or numbness around the injured area. °· You have severe pain or pressure under the cast. °· You have any difficulty with your breathing or have shortness of breath. °· You have chest pain. °Document Released: 02/03/2000 Document Revised: 11/26/2012 Document Reviewed: 08/14/2012 °Exit[employer]® Patient Information ©2015 [employer]. This information is not intended to replace advice given to you by your health care   provider. Make sure you discuss any questions you have with your health care provider.   Tarsal Fracture  with Rehab The tarsal bones are a collection of 7 bones that collectively make up the back half of the foot (hind foot). A tarsal fracture is a break in one of these bones. The 7 tarsal bones are the ankle bone (talus), heel bone (calcaneus), the 4 cuneiform bones, the cuboid, and the navicular. SYMPTOMS   Severe pain at the time of injury, which may persist for several weeks.  Tenderness, inflammation, or bruising (contusion) at the fracture site.  Swelling within the foot, causing numbness and paralysis, which are signs of nerve and blood vessel damage (uncommon). CAUSES  Tarsal fractures occur when a force is placed on  the bone that is greater than the bone can withstand. Common causes of injury include:  Direct trauma (injury from impact) to the foot.  Awkwardly landing on the foot after jumping.  Twisting injury to the ankle and foot. RISK INCREASES WITH:  Contact sports (football, rugby, or lacrosse).  Jumping sports (basketball or volleyball).  Previous ankle or foot injury.  Poor strength and flexibility. PREVENTION  Warm up and stretch properly before activity.  Maintain physical fitness:  Strength, flexibility, and endurance.  Cardiovascular fitness (increases heart rate).  Protect the ankle and foot with taping, braces, or compression bandages.  Wear properly fitted shoes that are appropriate for the activity. PROGNOSIS  If treated properly, tarsal fractures can be expected to heal with non-surgical treatment. Occasionally, surgery is necessary to treat bone fragments that are out of alignment (displaced fracture). RELATED COMPLICATIONS   Failure of the fracture to heal (nonunion).  Healing of the fracture in a poor position (malunion).  Recurring symptoms that result in a chronic problem.  Prolonged healing time, if improperly treated or re-injured.  Bone death, due to poor circulation to the fracture site (rare).  Arthritis of the foot. TREATMENT  Treatment first involves the use of ice and medicine, to reduce pain and inflammation. If the bone fragments are out of alignment (displaced fracture), then the bone must be immediately realigned (reduction) by a trained professional. Fractures that cannot be realigned by hand, or fractures where the bones poke out through the skin (open fracture), may require surgery to hold the fracture in place with screws, pins, and plates. Once in proper alignment, the foot and ankle must be kept still for a period of time to allow for healing. After this period, it is important to perform strengthening and stretching exercises to help regain  strength and a full range of motion. These exercises may be completed at home or with a therapist.  MEDICATION   If pain medicine is necessary, nonsteroidal anti-inflammatory medications (aspirin and ibuprofen) or other minor pain relievers (acetaminophen) are often recommended.  Do not take pain medication for 7 days before surgery.  Prescription pain relievers may be given if your caregiver thinks they are necessary. Use only as directed and only as much as you need. COLD THERAPY  Cold treatment (icing) relieves pain and reduces inflammation. Cold treatment should be applied for 10 to 15 minutes every 2 to 3 hours, and immediately after any activity that aggravates your symptoms. Use ice packs or an ice massage. SEEK MEDICAL CARE IF:   Treatment does not seem to help, or the condition worsens.  Any medicines produce negative side effects.  Any complications from surgery occur:  Pain, numbness, or coldness in the affected foot.  Discoloration beneath  the toenails (blue or gray) of the affected foot.  Signs of infections (fever, pain, inflammation, redness, or persistent bleeding).

## 2014-06-30 ENCOUNTER — Ambulatory Visit: Payer: Self-pay | Admitting: Diagnostic Neuroimaging

## 2014-07-05 ENCOUNTER — Encounter: Payer: Self-pay | Admitting: Diagnostic Neuroimaging

## 2014-10-15 ENCOUNTER — Inpatient Hospital Stay (HOSPITAL_COMMUNITY)
Admission: EM | Admit: 2014-10-15 | Discharge: 2014-10-18 | DRG: 872 | Disposition: A | Discharge status: Home / Self Care | Payer: Self-pay | Attending: Internal Medicine | Admitting: Internal Medicine

## 2014-10-15 ENCOUNTER — Encounter (HOSPITAL_COMMUNITY): Payer: Self-pay | Admitting: *Deleted

## 2014-10-15 ENCOUNTER — Emergency Department (HOSPITAL_COMMUNITY): Payer: Medicaid Other

## 2014-10-15 ENCOUNTER — Emergency Department (HOSPITAL_COMMUNITY): Payer: Self-pay

## 2014-10-15 DIAGNOSIS — D509 Iron deficiency anemia, unspecified: Secondary | ICD-10-CM | POA: Diagnosis present

## 2014-10-15 DIAGNOSIS — D72829 Elevated white blood cell count, unspecified: Secondary | ICD-10-CM | POA: Diagnosis present

## 2014-10-15 DIAGNOSIS — N179 Acute kidney failure, unspecified: Secondary | ICD-10-CM | POA: Diagnosis present

## 2014-10-15 DIAGNOSIS — A419 Sepsis, unspecified organism: Principal | ICD-10-CM | POA: Diagnosis present

## 2014-10-15 DIAGNOSIS — E876 Hypokalemia: Secondary | ICD-10-CM | POA: Diagnosis present

## 2014-10-15 DIAGNOSIS — F17210 Nicotine dependence, cigarettes, uncomplicated: Secondary | ICD-10-CM | POA: Diagnosis present

## 2014-10-15 DIAGNOSIS — Z79899 Other long term (current) drug therapy: Secondary | ICD-10-CM

## 2014-10-15 DIAGNOSIS — N10 Acute tubulo-interstitial nephritis: Secondary | ICD-10-CM | POA: Diagnosis present

## 2014-10-15 DIAGNOSIS — G43909 Migraine, unspecified, not intractable, without status migrainosus: Secondary | ICD-10-CM | POA: Diagnosis present

## 2014-10-15 HISTORY — DX: Gastro-esophageal reflux disease without esophagitis: K21.9

## 2014-10-15 HISTORY — DX: Major depressive disorder, single episode, unspecified: F32.9

## 2014-10-15 HISTORY — DX: Polycystic ovarian syndrome: E28.2

## 2014-10-15 HISTORY — DX: Migraine, unspecified, not intractable, without status migrainosus: G43.909

## 2014-10-15 HISTORY — DX: Depression, unspecified: F32.A

## 2014-10-15 HISTORY — DX: Gastric ulcer, unspecified as acute or chronic, without hemorrhage or perforation: K25.9

## 2014-10-15 LAB — I-STAT CG4 LACTIC ACID, ED: Lactic Acid, Venous: 0.77 mmol/L (ref 0.5–2.0)

## 2014-10-15 LAB — TSH: TSH: 0.414 u[IU]/mL (ref 0.350–4.500)

## 2014-10-15 LAB — URINE MICROSCOPIC-ADD ON

## 2014-10-15 LAB — COMPREHENSIVE METABOLIC PANEL
ALT: 14 U/L (ref 14–54)
ALT: 13 U/L — ABNORMAL LOW (ref 14–54)
AST: 18 U/L (ref 15–41)
AST: 18 U/L (ref 15–41)
Albumin: 3.8 g/dL (ref 3.5–5.0)
Albumin: 3 g/dL — ABNORMAL LOW (ref 3.5–5.0)
Alkaline Phosphatase: 63 U/L (ref 38–126)
Alkaline Phosphatase: 55 U/L (ref 38–126)
Anion gap: 11 (ref 5–15)
Anion gap: 8 (ref 5–15)
BUN: 15 mg/dL (ref 6–20)
BUN: 14 mg/dL (ref 6–20)
CO2: 25 mmol/L (ref 22–32)
CO2: 27 mmol/L (ref 22–32)
Calcium: 8.8 mg/dL — ABNORMAL LOW (ref 8.9–10.3)
Calcium: 8 mg/dL — ABNORMAL LOW (ref 8.9–10.3)
Chloride: 100 mmol/L — ABNORMAL LOW (ref 101–111)
Chloride: 104 mmol/L (ref 101–111)
Creatinine, Ser: 1.37 mg/dL — ABNORMAL HIGH (ref 0.44–1.00)
Creatinine, Ser: 1.13 mg/dL — ABNORMAL HIGH (ref 0.44–1.00)
GFR calc Af Amer: >60 mL/min (ref >60)
GFR calc Af Amer: >60 mL/min (ref >60)
GFR calc non Af Amer: 53 mL/min — ABNORMAL LOW (ref >60)
GFR calc non Af Amer: >60 mL/min (ref >60)
Glucose, Bld: 108 mg/dL — ABNORMAL HIGH (ref 65–99)
Glucose, Bld: 131 mg/dL — ABNORMAL HIGH (ref 65–99)
Potassium: 3.3 mmol/L — ABNORMAL LOW (ref 3.5–5.1)
Potassium: 2.8 mmol/L — ABNORMAL LOW (ref 3.5–5.1)
Sodium: 136 mmol/L (ref 135–145)
Sodium: 139 mmol/L (ref 135–145)
Total Bilirubin: 0.9 mg/dL (ref 0.3–1.2)
Total Bilirubin: 0.4 mg/dL (ref 0.3–1.2)
Total Protein: 7.1 g/dL (ref 6.5–8.1)
Total Protein: 6.2 g/dL — ABNORMAL LOW (ref 6.5–8.1)

## 2014-10-15 LAB — CBC WITH DIFFERENTIAL/PLATELET
Basophils Absolute: 0 10*3/uL (ref 0.0–0.1)
Basophils Absolute: 0 10*3/uL (ref 0.0–0.1)
Basophils Relative: 0 % (ref 0–1)
Basophils Relative: 0 % (ref 0–1)
Eosinophils Absolute: 0 10*3/uL (ref 0.0–0.7)
Eosinophils Absolute: 0 10*3/uL (ref 0.0–0.7)
Eosinophils Relative: 0 % (ref 0–5)
Eosinophils Relative: 0 % (ref 0–5)
HCT: 40.6 % (ref 36.0–46.0)
HCT: 36 % (ref 36.0–46.0)
Hemoglobin: 13.7 g/dL (ref 12.0–15.0)
Hemoglobin: 12.1 g/dL (ref 12.0–15.0)
Lymphocytes Relative: 12 % (ref 12–46)
Lymphocytes Relative: 14 % (ref 12–46)
Lymphs Abs: 2.5 10*3/uL (ref 0.7–4.0)
Lymphs Abs: 2.4 10*3/uL (ref 0.7–4.0)
MCH: 29.3 pg (ref 26.0–34.0)
MCH: 29.4 pg (ref 26.0–34.0)
MCHC: 33.7 g/dL (ref 30.0–36.0)
MCHC: 33.6 g/dL (ref 30.0–36.0)
MCV: 86.8 fL (ref 78.0–100.0)
MCV: 87.4 fL (ref 78.0–100.0)
Monocytes Absolute: 1.8 10*3/uL — ABNORMAL HIGH (ref 0.1–1.0)
Monocytes Absolute: 1.4 10*3/uL — ABNORMAL HIGH (ref 0.1–1.0)
Monocytes Relative: 9 % (ref 3–12)
Monocytes Relative: 8 % (ref 3–12)
Neutro Abs: 16.4 10*3/uL — ABNORMAL HIGH (ref 1.7–7.7)
Neutro Abs: 13.5 10*3/uL — ABNORMAL HIGH (ref 1.7–7.7)
Neutrophils Relative %: 79 % — ABNORMAL HIGH (ref 43–77)
Neutrophils Relative %: 78 % — ABNORMAL HIGH (ref 43–77)
Platelets: 160 10*3/uL (ref 150–400)
Platelets: 124 10*3/uL — ABNORMAL LOW (ref 150–400)
RBC: 4.68 MIL/uL (ref 3.87–5.11)
RBC: 4.12 MIL/uL (ref 3.87–5.11)
RDW: 12.6 % (ref 11.5–15.5)
RDW: 12.7 % (ref 11.5–15.5)
WBC: 20.7 10*3/uL — ABNORMAL HIGH (ref 4.0–10.5)
WBC: 17.4 10*3/uL — ABNORMAL HIGH (ref 4.0–10.5)

## 2014-10-15 LAB — URINALYSIS, ROUTINE W REFLEX MICROSCOPIC
Bilirubin Urine: NEGATIVE
Glucose, UA: NEGATIVE mg/dL
Ketones, ur: 15 mg/dL — AB
Nitrite: NEGATIVE
Protein, ur: 30 mg/dL — AB
Specific Gravity, Urine: 1.041 — ABNORMAL HIGH (ref 1.005–1.030)
Urobilinogen, UA: 0.2 mg/dL (ref 0.0–1.0)
pH: 5.5 (ref 5.0–8.0)

## 2014-10-15 LAB — LIPASE, BLOOD: Lipase: 14 U/L — ABNORMAL LOW (ref 22–51)

## 2014-10-15 LAB — I-STAT TROPONIN, ED: Troponin i, poc: 0 ng/mL (ref 0.00–0.08)

## 2014-10-15 LAB — LACTIC ACID, PLASMA
Lactic Acid, Venous: 0.9 mmol/L (ref 0.5–2.0)
Lactic Acid, Venous: 1.3 mmol/L (ref 0.5–2.0)

## 2014-10-15 LAB — PHOSPHORUS: Phosphorus: 2.7 mg/dL (ref 2.5–4.6)

## 2014-10-15 LAB — I-STAT BETA HCG BLOOD, ED (MC, WL, AP ONLY): I-stat hCG, quantitative: <5 m[IU]/mL (ref <5)

## 2014-10-15 LAB — MAGNESIUM: Magnesium: 1.7 mg/dL (ref 1.7–2.4)

## 2014-10-15 LAB — PROTIME-INR
INR: 1.32 (ref 0.00–1.49)
Prothrombin Time: 16.5 seconds — ABNORMAL HIGH (ref 11.6–15.2)

## 2014-10-15 LAB — APTT: aPTT: 34 seconds (ref 24–37)

## 2014-10-15 LAB — PROCALCITONIN: Procalcitonin: 0.26 ng/mL

## 2014-10-15 MED ORDER — ACETAMINOPHEN 500 MG PO TABS
1000.0000 mg | ORAL_TABLET | Freq: Once | ORAL | Status: AC
  Administered 2014-10-15: 1000 mg via ORAL
  Filled 2014-10-15: qty 2

## 2014-10-15 MED ORDER — MORPHINE SULFATE (PF) 4 MG/ML IV SOLN
4.0000 mg | Freq: Once | INTRAVENOUS | Status: AC
  Administered 2014-10-15: 4 mg via INTRAVENOUS
  Filled 2014-10-15: qty 1

## 2014-10-15 MED ORDER — SODIUM CHLORIDE 0.9 % IV BOLUS (SEPSIS)
1000.0000 mL | Freq: Once | INTRAVENOUS | Status: AC
  Administered 2014-10-15: 1000 mL via INTRAVENOUS

## 2014-10-15 MED ORDER — INFLUENZA VAC SPLIT QUAD 0.5 ML IM SUSY
0.5000 mL | PREFILLED_SYRINGE | INTRAMUSCULAR | Status: AC
  Administered 2014-10-18: 0.5 mL via INTRAMUSCULAR
  Filled 2014-10-15: qty 0.5

## 2014-10-15 MED ORDER — ONDANSETRON HCL 4 MG/2ML IJ SOLN
4.0000 mg | Freq: Once | INTRAMUSCULAR | Status: AC
  Administered 2014-10-15: 4 mg via INTRAVENOUS
  Filled 2014-10-15: qty 2

## 2014-10-15 MED ORDER — PNEUMOCOCCAL VAC POLYVALENT 25 MCG/0.5ML IJ INJ
0.5000 mL | INJECTION | INTRAMUSCULAR | Status: AC
  Administered 2014-10-18: 0.5 mL via INTRAMUSCULAR
  Filled 2014-10-15: qty 0.5

## 2014-10-15 MED ORDER — ACETAMINOPHEN 325 MG PO TABS
650.0000 mg | ORAL_TABLET | Freq: Four times a day (QID) | ORAL | Status: DC | PRN
  Administered 2014-10-15 – 2014-10-16 (×3): 650 mg via ORAL
  Filled 2014-10-15 (×3): qty 2

## 2014-10-15 MED ORDER — DEXTROSE 5 % IV SOLN
1.0000 g | Freq: Once | INTRAVENOUS | Status: AC
  Administered 2014-10-15: 1 g via INTRAVENOUS
  Filled 2014-10-15: qty 10

## 2014-10-15 MED ORDER — DEXTROSE 5 % IV SOLN: 2.0000 g | Freq: Once | INTRAVENOUS | Status: DC

## 2014-10-15 MED ORDER — SODIUM CHLORIDE 0.9 % IV SOLN
INTRAVENOUS | Status: DC
  Administered 2014-10-15 – 2014-10-17 (×4): via INTRAVENOUS

## 2014-10-15 MED ORDER — DEXTROSE 5 % IV SOLN
1.0000 g | INTRAVENOUS | Status: DC
  Administered 2014-10-16 – 2014-10-17 (×2): 1 g via INTRAVENOUS
  Filled 2014-10-15 (×3): qty 10

## 2014-10-15 MED ORDER — ONDANSETRON HCL 4 MG/2ML IJ SOLN: 4.0000 mg | Freq: Four times a day (QID) | INTRAMUSCULAR | Status: DC | PRN

## 2014-10-15 MED ORDER — ONDANSETRON HCL 4 MG PO TABS: 4.0000 mg | ORAL_TABLET | Freq: Four times a day (QID) | ORAL | Status: DC | PRN

## 2014-10-15 MED ORDER — POTASSIUM CHLORIDE CRYS ER 20 MEQ PO TBCR
20.0000 meq | EXTENDED_RELEASE_TABLET | Freq: Once | ORAL | Status: AC
  Administered 2014-10-15: 20 meq via ORAL
  Filled 2014-10-15: qty 1

## 2014-10-15 MED ORDER — FERROUS SULFATE 325 (65 FE) MG PO TABS
325.0000 mg | ORAL_TABLET | Freq: Every day | ORAL | Status: DC
  Administered 2014-10-15 – 2014-10-17 (×3): 325 mg via ORAL
  Filled 2014-10-15 (×3): qty 1

## 2014-10-15 MED ORDER — IOHEXOL 300 MG/ML  SOLN
80.0000 mL | Freq: Once | INTRAMUSCULAR | Status: AC | PRN
  Administered 2014-10-15: 100 mL via INTRAVENOUS

## 2014-10-15 MED ORDER — PANTOPRAZOLE SODIUM 40 MG IV SOLR
40.0000 mg | Freq: Once | INTRAVENOUS | Status: AC
  Administered 2014-10-15: 40 mg via INTRAVENOUS
  Filled 2014-10-15: qty 40

## 2014-10-15 MED ORDER — IOHEXOL 300 MG/ML  SOLN
25.0000 mL | Freq: Once | INTRAMUSCULAR | Status: AC | PRN
  Administered 2014-10-15: 25 mL via ORAL

## 2014-10-15 MED ORDER — ACETAMINOPHEN 650 MG RE SUPP: 650.0000 mg | Freq: Four times a day (QID) | RECTAL | Status: DC | PRN

## 2014-10-15 NOTE — Progress Notes (Signed)
Utilization Review completed. Jehad Bisono RN BSN CM 

## 2014-10-15 NOTE — ED Notes (Signed)
Paged MD RE ceftriaxone order.

## 2014-10-15 NOTE — ED Provider Notes (Signed)
CSN: [id number]     Arrival date & time 10/15/14  1003 History   First MD Initiated Contact with Patient 10/15/14 1006     Chief Complaint  Patient presents with  . Abdominal Pain  . Hematemesis     (Consider location/radiation/quality/duration/timing/severity/associated sxs/prior Treatment) The history is provided by the patient.     Patient is a 24 year old female with history of anxiety, PCO S and acid reflux who presents to the emergency department with abdominal, back and chest pain and 1 episode of vomiting this morning with hematemesis. Her emesis this morning she describes as bright red and half clear, but is limited to one episode of vomiting, and she denies any nausea at this time. Yesterday her symptoms began with loss of appetite, nausea, fever, chills and a mild cough.  She also complains of acid reflux which is uncontrolled on tums. She states that her pain is located in her right side of her abdomen some radiation to her right flank and also has pinpoint pain located in her left chest wall.  All these pains are exacerbated with inspiration, movement, and they are sharp and rated 7 out of 10. She denies any abdominal surgery, she does have a history of gallstones and was at one point scheduled for cholecystectomy, but states that it was canceled. Her pain at this time does not feel nauseated when she had gallstones.  She denies any shortness of breath, chest pain, palpitations, lower extremity edema. She is a current smoker, approximately 2 pack history. She denies any alcohol use. States that she is frequently dehydrated because she doesn't drink water, her urine has recently been dark orange, but she denies any dysuria or hematuria. She states that she is generally constipated, and she has not had any change in her bowel movement patterns. She denies a possibility pregnancy, states that she is not sexually active and her last menstrual period was in January, it's very infrequent due to  PCOS.  Past Medical History  Diagnosis Date  . Anxious mood   . Polycystic ovarian syndrome    No past surgical history on file. Family History  Problem Relation Age of Onset  . Migraines Mother   . Migraines Maternal Aunt   . Multiple sclerosis Maternal Grandmother   . Migraines Maternal Grandmother    Social History  Substance Use Topics  . Smoking status: Current Every Day Smoker -- 1.00 packs/day    Types: Cigarettes  . Smokeless tobacco: None  . Alcohol Use: No   OB History    No data available     Review of Systems  Constitutional: Negative for fatigue.  HENT: Negative.   Eyes: Negative.   Respiratory: Negative for shortness of breath, wheezing and stridor.   Cardiovascular: Negative for chest pain, palpitations and leg swelling.  Gastrointestinal: Negative for diarrhea, blood in stool, abdominal distention and rectal pain.  Endocrine: Negative.   Genitourinary: Positive for flank pain. Negative for dysuria, urgency, frequency, hematuria, vaginal bleeding, vaginal discharge, vaginal pain, menstrual problem and pelvic pain.  Musculoskeletal: Negative.   Neurological: Negative.   Psychiatric/Behavioral: Negative.       Allergies  Shrimp and Latex  Home Medications   Prior to Admission medications   Medication Sig Start Date End Date Taking? Authorizing Provider  acetaminophen (TYLENOL) 325 MG tablet Take 650 mg by mouth every 6 (six) hours as needed.   Yes Historical Provider, MD  Calcium Carbonate Antacid (TUMS PO) Take 2 tablets by mouth as needed (for acid  reflux).    Yes Historical Provider, MD  Diphenhydramine-APAP, sleep, (GOODYS PM PO) Take 1 Package by mouth.   Yes Historical Provider, MD  ferrous sulfate 325 (65 FE) MG tablet Take 325 mg by mouth at bedtime.    Yes Historical Provider, MD  ibuprofen (ADVIL,MOTRIN) 800 MG tablet Take 1 tablet (800 mg total) by mouth 3 (three) times daily. 06/24/14  Yes Tiffany Neva Seat, PA-C  LORazepam (ATIVAN) 0.5 MG  tablet Take 0.5 mg by mouth 2 (two) times daily as needed for anxiety.   Yes Historical Provider, MD  Multiple Vitamin (MULTIVITAMIN WITH MINERALS) TABS tablet Take 1 tablet by mouth daily.   Yes Historical Provider, MD  valACYclovir (VALTREX) 500 MG tablet Take 500 mg by mouth at bedtime.  04/06/14  Yes Historical Provider, MD  rizatriptan (MAXALT-MLT) 10 MG disintegrating tablet Take 1 tablet (10 mg total) by mouth as needed for migraine. May repeat in 2 hours if needed 05/17/14   Suanne Marker, MD  topiramate (TOPAMAX) 50 MG tablet Take 1 tablet (50 mg total) by mouth 2 (two) times daily. 05/17/14   Suanne Marker, MD   BP 121/70 mmHg  Pulse 103  Temp(Src) 100.7 F (38.2 C) (Oral)  Resp 24  Ht  (1.651 m)  Wt 200 lb (90.719 kg)  BMI 33.28 kg/m2  SpO2 98% Physical Exam  Constitutional: She is oriented to person, place, and time. She appears well-developed and well-nourished. No distress.  Well-developed well-nourished female, appears stated age, nontoxic appearing and in NAD, appears to be uncomfortable  HENT:  Head: Normocephalic and atraumatic.  Nose: Nose normal.  Mouth/Throat: Oropharynx is clear and moist. No oropharyngeal exudate.  Eyes: Conjunctivae and EOM are normal. Pupils are equal, round, and reactive to light. Right eye exhibits no discharge. Left eye exhibits no discharge. No scleral icterus.  Neck: Normal range of motion. No JVD present. No tracheal deviation present. No thyromegaly present.  Cardiovascular: Normal rate, regular rhythm, normal heart sounds and intact distal pulses.  Exam reveals no gallop and no friction rub.   No murmur heard. Pulmonary/Chest: Effort normal and breath sounds normal. No respiratory distress. She has no wheezes. She has no rales. She exhibits no tenderness.  Clear to auscultation anteriorly and posteriorly without wheezes rales or rhonchi  Abdominal: Soft. Bowel sounds are normal. She exhibits no distension and no mass. There is  tenderness. There is guarding. There is no rebound.  Abdomen soft, obese, normal bowel sounds are poor, tenderness to palpation and left lower quadrant and right lower quadrant, with guarding, right-sided CVA tenderness Negative Rovsing's a negative psoas, negative obturator signs Negative Murphy's, positive McBurney's point  Musculoskeletal: Normal range of motion. She exhibits no edema or tenderness.  Lymphadenopathy:    She has no cervical adenopathy.  Neurological: She is alert and oriented to person, place, and time. She has normal reflexes. No cranial nerve deficit. She exhibits normal muscle tone. Coordination normal.  Skin: Skin is warm and dry. No rash noted. She is not diaphoretic. No erythema. No pallor.  Psychiatric: She has a normal mood and affect. Her behavior is normal. Judgment and thought content normal.  Nursing note and vitals reviewed.   ED Course  Procedures (including critical care time) Labs Review Labs Reviewed  CBC WITH DIFFERENTIAL/PLATELET - Abnormal; Notable for the following:    WBC 20.7 (*)    Neutrophils Relative % 79 (*)    Neutro Abs 16.4 (*)    Monocytes Absolute 1.8 (*)  All other components within normal limits  COMPREHENSIVE METABOLIC PANEL - Abnormal; Notable for the following:    Potassium 3.3 (*)    Chloride 100 (*)    Glucose, Bld 108 (*)    Creatinine, Ser 1.37 (*)    Calcium 8.8 (*)    GFR calc non Af Amer 53 (*)    All other components within normal limits  LIPASE, BLOOD - Abnormal; Notable for the following:    Lipase 14 (*)    All other components within normal limits  URINALYSIS, ROUTINE W REFLEX MICROSCOPIC (NOT AT [facility])  I-STAT TROPOININ, ED  I-STAT BETA HCG BLOOD, ED ([facility], [facility], [facility] ONLY)    Imaging Review Dg Chest 2 View  10/15/2014   CLINICAL DATA:  Right-sided chest pain and hematemesis for 1 day.  EXAM: CHEST  2 VIEW  COMPARISON:  04/18/2014 and prior chest radiographs dating back to 10/15/2008  FINDINGS: The  cardiomediastinal silhouette is unremarkable.  There is no evidence of focal airspace disease, pulmonary edema, suspicious pulmonary nodule/mass, pleural effusion, or pneumothorax. No acute bony abnormalities are identified.  IMPRESSION: No active cardiopulmonary disease.   Electronically Signed   By: Harmon Pier M.D.   On: 10/15/2014 11:37   Ct Abdomen Pelvis W Contrast  10/15/2014   CLINICAL DATA:  Right side pain  EXAM: CT ABDOMEN AND PELVIS WITH CONTRAST  TECHNIQUE: Multidetector CT imaging of the abdomen and pelvis was performed using the standard protocol following bolus administration of intravenous contrast.  CONTRAST:  OMNIPAQUE IOHEXOL 300 MG/ML  SOLN  COMPARISON:  None.  FINDINGS: Lung bases are unremarkable. Sagittal images of the spine shows mild degenerative changes lower thoracic spine. Liver, pancreas, spleen and adrenal glands are unremarkable. No calcified gallstones are noted within gallbladder. Small hiatal hernia. There is subtle mild right perinephric stranding. Subtle mild swelling of the right kidney. There is small focus of cortical decreased density in midpole medial aspect of the right kidney measures about 2.5 cm. Similar focus is noted in midpole laterally measures about 1.4 cm. Findings are highly suspicious for multifocal pyelonephritis. Clinical correlation is necessary. Abdominal aorta is unremarkable. There is a cyst within left kidney anteriorly measures 1.2 cm. No hydronephrosis or hydroureter bilaterally.  Normal appendix no pericecal inflammation. No small bowel obstruction. Small amount of free simple fluid within pelvis posteriorly. The uterus and ovaries are unremarkable. Urinary bladder is unremarkable. No colitis or diverticulitis. No free abdominal air. No adenopathy.  IMPRESSION: 1. Probable right pyelonephritis as described above. Clinical correlation is necessary. 2. No small bowel or colonic obstruction. 3. Normal appendix. 4. Small free fluid noted within  posterior pelvis. No ovarian or adnexal mass. Unremarkable uterus. 5. No small bowel obstruction. These results were called by telephone at the time of interpretation on 10/15/2014 at 12:34 pm to Dr. Danelle Berry , who verbally acknowledged these results.   Electronically Signed   By: Natasha Mead M.D.   On: 10/15/2014 12:34   I have personally reviewed and evaluated these images and lab results as part of my medical decision-making.   EKG Interpretation   Date/Time:  Friday October 15 2014 11:37:40 EDT Ventricular Rate:  109 PR Interval:  152 QRS Duration: 99 QT Interval:  313 QTC Calculation: 421 R Axis:   83 Text Interpretation:  Sinus tachycardia Ventricular premature complex  Borderline repolarization abnormality Confirmed by Rubin Payor  MD, NATHAN  [id number]) on 10/15/2014 11:46:54 AM      MDM   Final diagnoses:  None  Patient with multiple pain complaints, her right side and right back, with loss of appetite, vomiting and hematemesis  on exam is diffusely tender to palpation, with right CVA tenderness Patient will be treated with pain meds, anti-nauseousness, Protonix, Tylenol, CT abdomen and pelvis to rule out acute appendicitis, diffuse abdominal pain may also be pyelonephritis versus nephrolithiasis Patient states she is unable to give a urine sample this time, we will seek to obtain after a liter bolus   12:44 PM - Ct abd/pelvis concerning for right pyelonephritis. The patient has received very little of her IV fluids, and has been unable to give a urine sample. This is needed for clinical correlation prior to admission for IV antibiotic for pyelonephritis.  IV Rocephin initiated.  Patient states that her pain is at 0 out of 10 at this time.  She is still febrile 100.7, with mild tachycardia and normal blood pressure - added lactic acid  Pt UA consistent with UTI/pyelonephritis.  Lactic acid is negative.  IV rocephin was initiated with additional IVF.  Pt states that her pain is  improved, she has not had any active vomiting while in the ER, she is asking to eat.  [facility] was called for admission for pyelonephritis with leukocytosis, mild hypokalemia, and AKI stage 1, likely secondary to dehydration/infection. Pt was admitted by Dr. Elisabeth Pigeon for further tx.  Danelle Berry, PA-C 10/18/14 0444  Benjiman Core, MD 10/20/14 [phone]

## 2014-10-15 NOTE — ED Notes (Signed)
Attempted report 

## 2014-10-15 NOTE — ED Notes (Signed)
Pt reports having right side pain that radiates to abd, has intermittent chest pains and back pain. Reports vomiting blood x 1 this am. Denies diarrhea.

## 2014-10-15 NOTE — H&P (Signed)
[facility] History and Physical  Betty Dean WCB:[id number] DOB: 01-Oct-1990 DOA: 10/15/2014  Referring physician: ER PA: Delsa Grana PCP: Tula Nakayama  Chief Complaint: abdominal and back pain    HPI:  24 year old female with history of anxiety who presented to [facility] ED with worsening abdominal pain that radiated to the right side of her back and flank area. Pain was sharp in nature, 7/10 to 10/10 in intensity, present at rest and with movement. Patient reported associated nausea, non bloody vomiting, fevers, chills. She also reported difficulty ambulating due to pain. Pain was better with pain meds given in ED. No reports of diarrhea or constipation. No reports of blood in stool or urine. No lightheadedness or loss of consciousness. No shortness of breath or chest pain.   In ED, BP was 97/54, HR 80-113, RR 13-27, T max 101.6 F and oxygen saturation of 93% on room air. Blood work was notable for WBC count of 20.7, potassium 3.3, creatinine 1.37. CXR showed no acute cardiopulmonary findings. CT scan demonstrated acute right side pyelonephritis. She was started on empiric Rocephin and admitted for acute pyelonephritis management.    Assessment & Plan    Principal Problem: Sepsis / Acute pyelonephritis / Leukocytosis - Sepsis criteria met on admission with hypotension, tachycardia, tachypnea, fever, leukocytosis - Sepsis work up initiated - Started Rocephin  - Follow up lactic acid, procalcitonin, blood and urine culture results.  - Pt is hemodynamically stable, does not require pressor support  Active Problems:   Hypokalemia - Likely due to acute renal failure, acute infection - Supplemented - Follow up BMP tomorrow am    Anemia, iron deficiency - Continue ferrous sulfate supplementation    Acute renal failure - Likely due to acute pyelonephritis - Continue IV fluids - Follow up BMP tomorrow am     DVT prophylaxis:  - SCD"s bilaterally   Radiological Exams on  Admission: Dg Chest 2 View 10/15/2014    No active cardiopulmonary disease.   Electronically Signed   By: Margarette Canada M.D.   On: 10/15/2014 11:37   Ct Abdomen Pelvis W Contrast 10/15/2014   1. Probable right pyelonephritis as described above. Clinical correlation is necessary. 2. No small bowel or colonic obstruction. 3. Normal appendix. 4. Small free fluid noted within posterior pelvis. No ovarian or adnexal mass. Unremarkable uterus. 5. No small bowel obstruction. These results were called by telephone at the time of interpretation on 10/15/2014 at 12:34 pm to Dr. Delsa Grana , who verbally acknowledged these results.   Electronically Signed   By: Lahoma Crocker M.D.   On: 10/15/2014 12:34    Code Status: Full Family Communication: Plan of care discussed with the patient  Disposition Plan: Admit for further evaluation  Leisa Lenz, MD  [facility] Pager [phone]  Time spent in minutes: 75 minutes  Review of Systems:  Constitutional: Negative for fever, chills and malaise/fatigue. Negative for diaphoresis.  HENT: Negative for hearing loss, ear pain, nosebleeds, congestion, sore throat, neck pain, tinnitus and ear discharge.   Eyes: Negative for blurred vision, double vision, photophobia, pain, discharge and redness.  Respiratory: Negative for cough, hemoptysis, sputum production, shortness of breath, wheezing and stridor.   Cardiovascular: Negative for chest pain, palpitations, orthopnea, claudication and leg swelling.  Gastrointestinal: per HPI.  Genitourinary: Negative for dysuria, urgency, frequency, hematuria and flank pain.  Musculoskeletal: Negative for myalgias, back pain, joint pain and falls.  Skin: Negative for itching and rash.  Neurological: Negative for dizziness and weakness. Negative  for tingling, tremors, sensory change, speech change, focal weakness, loss of consciousness and headaches.  Endo/Heme/Allergies: Negative for environmental allergies and polydipsia. Does not  bruise/bleed easily.  Psychiatric/Behavioral: Negative for suicidal ideas. The patient is not nervous/anxious.      Past Medical History  Diagnosis Date  . Anxious mood   . Polycystic ovarian syndrome   . GERD (gastroesophageal reflux disease)   . Stomach ulcer   . Migraine     "maybe once/wk" (10/15/2014)  . Depression    Past Surgical History  Procedure Laterality Date  . No past surgeries     Social History:  reports that she has been smoking Cigarettes.  She has a 2 pack-year smoking history. She has never used smokeless tobacco. She reports that she drinks alcohol. She reports that she uses illicit drugs (Marijuana).  Allergies  Allergen Reactions  . Shrimp [Shellfish Allergy] Itching    Rash / Swelling  . Latex Itching and Rash    Family History:  Family History  Problem Relation Age of Onset  . Migraines Mother   . Migraines Maternal Aunt   . Multiple sclerosis Maternal Grandmother   . Migraines Maternal Grandmother      Prior to Admission medications   Medication Sig Start Date End Date Taking? Authorizing Provider  acetaminophen (TYLENOL) 325 MG tablet Take 650 mg by mouth every 6 (six) hours as needed.   Yes Historical Provider, MD  Calcium Carbonate Antacid (TUMS PO) Take 2 tablets by mouth as needed (for acid reflux).    Yes Historical Provider, MD  ferrous sulfate 325 (65 FE) MG tablet Take 325 mg by mouth at bedtime.    Yes Historical Provider, MD  ibuprofen (ADVIL,MOTRIN) 800 MG tablet Take 1 tablet (800 mg total) by mouth 3 (three) times daily. 06/24/14  Yes Tiffany Carlota Raspberry, PA-C  LORazepam (ATIVAN) 0.5 MG tablet Take 0.5 mg by mouth 2 (two) times daily as needed for anxiety.   Yes Historical Provider, MD  Multiple Vitamin (MULTIVITAMIN WITH MINERALS) TABS tablet Take 1 tablet by mouth daily.   Yes Historical Provider, MD  valACYclovir (VALTREX) 500 MG tablet Take 500 mg by mouth at bedtime.  04/06/14  Yes Historical Provider, MD  rizatriptan (MAXALT-MLT) 10  MG disintegrating tablet Take 1 tablet (10 mg total) by mouth as needed for migraine. May repeat in 2 hours if needed 05/17/14   Penni Bombard, MD   Physical Exam: Filed Vitals:   10/15/14 1515 10/15/14 1530 10/15/14 1533 10/15/14 1710  BP:  97/54 102/58   Pulse: 90 80    Temp:      TempSrc:      Resp: 16 18    Height:    '5\' 5"'$  (1.651 m)  Weight:      SpO2: 98% 95%      Physical Exam  Constitutional: Appears well-developed and well-nourished. No distress.  HENT: Normocephalic. No tonsillar erythema or exudates Eyes: Conjunctivae are normal. No scleral icterus.  Neck: Normal ROM. Neck supple. No JVD. No tracheal deviation. No thyromegaly.  CVS: RRR, S1/S2 +, no murmurs, no gallops, no carotid bruit.  Pulmonary: Effort and breath sounds normal, no stridor, rhonchi, wheezes, rales.  Abdominal: Soft. BS +,  no distension, tenderness appreciated on right flank area with palpation, no rebound or guarding.  Musculoskeletal: Normal range of motion. No edema and no tenderness.  Lymphadenopathy: No lymphadenopathy noted, cervical, inguinal. Neuro: Alert. Normal reflexes, muscle tone coordination. No focal neurologic deficits. Skin: Skin is warm and dry.  No rash noted.  No erythema. No pallor.  Psychiatric: Normal mood and affect. Behavior, judgment, thought content normal.   Labs on Admission:  Basic Metabolic Panel:  Recent Labs Lab 10/15/14 1117  NA 136  K 3.3*  CL 100*  CO2 25  GLUCOSE 108*  BUN 15  CREATININE 1.37*  CALCIUM 8.8*   Liver Function Tests:  Recent Labs Lab 10/15/14 1117  AST 18  ALT 14  ALKPHOS 63  BILITOT 0.9  PROT 7.1  ALBUMIN 3.8    Recent Labs Lab 10/15/14 1117  LIPASE 14*   No results for input(s): AMMONIA in the last 168 hours. CBC:  Recent Labs Lab 10/15/14 1117 10/15/14 1828  WBC 20.7* 17.4*  NEUTROABS 16.4* 13.5*  HGB 13.7 12.1  HCT 40.6 36.0  MCV 86.8 87.4  PLT 160 124*   Cardiac Enzymes: No results for input(s):  CKTOTAL, CKMB, CKMBINDEX, TROPONINI in the last 168 hours. BNP: Invalid input(s): POCBNP CBG: No results for input(s): GLUCAP in the last 168 hours.  If 7PM-7AM, please contact night-coverage [email or web address] Password [facility] 10/15/2014, 7:42 PM

## 2014-10-16 DIAGNOSIS — N10 Acute tubulo-interstitial nephritis: Secondary | ICD-10-CM

## 2014-10-16 DIAGNOSIS — N179 Acute kidney failure, unspecified: Secondary | ICD-10-CM

## 2014-10-16 DIAGNOSIS — E876 Hypokalemia: Secondary | ICD-10-CM

## 2014-10-16 DIAGNOSIS — A419 Sepsis, unspecified organism: Principal | ICD-10-CM

## 2014-10-16 LAB — COMPREHENSIVE METABOLIC PANEL
ALT: 13 U/L — ABNORMAL LOW (ref 14–54)
AST: 17 U/L (ref 15–41)
Albumin: 3.1 g/dL — ABNORMAL LOW (ref 3.5–5.0)
Alkaline Phosphatase: 58 U/L (ref 38–126)
Anion gap: 10 (ref 5–15)
BUN: 11 mg/dL (ref 6–20)
CO2: 25 mmol/L (ref 22–32)
Calcium: 7.7 mg/dL — ABNORMAL LOW (ref 8.9–10.3)
Chloride: 102 mmol/L (ref 101–111)
Creatinine, Ser: 1.03 mg/dL — ABNORMAL HIGH (ref 0.44–1.00)
GFR calc Af Amer: >60 mL/min (ref >60)
GFR calc non Af Amer: >60 mL/min (ref >60)
Glucose, Bld: 98 mg/dL (ref 65–99)
Potassium: 3.1 mmol/L — ABNORMAL LOW (ref 3.5–5.1)
Sodium: 137 mmol/L (ref 135–145)
Total Bilirubin: 0.5 mg/dL (ref 0.3–1.2)
Total Protein: 6.1 g/dL — ABNORMAL LOW (ref 6.5–8.1)

## 2014-10-16 LAB — CBC
HCT: 33.8 % — ABNORMAL LOW (ref 36.0–46.0)
Hemoglobin: 11.3 g/dL — ABNORMAL LOW (ref 12.0–15.0)
MCH: 29.1 pg (ref 26.0–34.0)
MCHC: 33.4 g/dL (ref 30.0–36.0)
MCV: 87.1 fL (ref 78.0–100.0)
Platelets: 126 10*3/uL — ABNORMAL LOW (ref 150–400)
RBC: 3.88 MIL/uL (ref 3.87–5.11)
RDW: 12.7 % (ref 11.5–15.5)
WBC: 17.1 10*3/uL — ABNORMAL HIGH (ref 4.0–10.5)

## 2014-10-16 LAB — GLUCOSE, CAPILLARY: Glucose-Capillary: 93 mg/dL (ref 65–99)

## 2014-10-16 MED ORDER — POTASSIUM CHLORIDE CRYS ER 20 MEQ PO TBCR
40.0000 meq | EXTENDED_RELEASE_TABLET | Freq: Once | ORAL | Status: AC
  Administered 2014-10-16: 40 meq via ORAL
  Filled 2014-10-16: qty 2

## 2014-10-16 MED ORDER — ENOXAPARIN SODIUM 40 MG/0.4ML ~~LOC~~ SOLN
40.0000 mg | SUBCUTANEOUS | Status: DC
  Administered 2014-10-16 – 2014-10-17 (×2): 40 mg via SUBCUTANEOUS
  Filled 2014-10-16 (×2): qty 0.4

## 2014-10-16 MED ORDER — SUMATRIPTAN SUCCINATE 100 MG PO TABS
100.0000 mg | ORAL_TABLET | ORAL | Status: DC | PRN
  Filled 2014-10-16: qty 1

## 2014-10-16 MED ORDER — HYDROMORPHONE HCL 1 MG/ML IJ SOLN
1.0000 mg | Freq: Once | INTRAMUSCULAR | Status: AC
  Administered 2014-10-16: 1 mg via INTRAVENOUS
  Filled 2014-10-16: qty 1

## 2014-10-16 MED ORDER — TOPIRAMATE 25 MG PO TABS
50.0000 mg | ORAL_TABLET | Freq: Two times a day (BID) | ORAL | Status: DC
Start: 2014-10-16 — End: 2014-10-18
  Administered 2014-10-16 – 2014-10-18 (×5): 50 mg via ORAL
  Filled 2014-10-16 (×5): qty 2

## 2014-10-16 MED ORDER — RIZATRIPTAN BENZOATE 10 MG PO TBDP: 10.0000 mg | ORAL_TABLET | ORAL | Status: DC | PRN

## 2014-10-16 NOTE — Progress Notes (Signed)
PATIENT DETAILS Name: Betty Dean Age: 24 y.o. Sex: female Date of Birth: 1990/07/25 Admit Date: 10/15/2014 Admitting Physician Alison Murray, MD LKG:[id number], PA-C  Subjective: Right flank pain much improved.  Assessment/Plan: Principal Problem:  Acute pyelonephritis with sepsis: Continue with IV Rocephin, although improved with decreasing leukocytosis appears to be febrile. Await blood cultures, unfortunately urine cultures not sent on admission-have ordered today. Follow clinical course.  Active Problems: Hypokalemia: Secondary to GI loss, replete and recheck.  AKI: Secondary to prerenal azotemia from vomiting. Continue to hydrate and Follow  History of migraine: Continue Topamax  Disposition: Remain inpatient  Antimicrobial agents  See below  Anti-infectives    Start     Dose/Rate Route Frequency Ordered Stop   10/16/14 1500  cefTRIAXone (ROCEPHIN) 1 g in dextrose 5 % 50 mL IVPB     1 g 100 mL/hr over 30 Minutes Intravenous Every 24 hours 10/15/14 1554     10/15/14 1515  cefTRIAXone (ROCEPHIN) 2 g in dextrose 5 % 50 mL IVPB  Status:  Discontinued     2 g 100 mL/hr over 30 Minutes Intravenous  Once 10/15/14 1502 10/15/14 1554   10/15/14 1245  cefTRIAXone (ROCEPHIN) 1 g in dextrose 5 % 50 mL IVPB     1 g 100 mL/hr over 30 Minutes Intravenous  Once 10/15/14 1237 10/15/14 1522      DVT Prophylaxis: Prophylactic Lovenox  Code Status: Full code   Family Communication None at bedside  Procedures: None  CONSULTS:  None  Time spent 40 minutes-Greater than 50% of this time was spent in counseling, explanation of diagnosis, planning of further management, and coordination of care.  MEDICATIONS: Scheduled Meds: . cefTRIAXone (ROCEPHIN)  IV  1 g Intravenous Q24H  . ferrous sulfate  325 mg Oral QHS  . Influenza vac split quadrivalent PF  0.5 mL Intramuscular Tomorrow-1000  . pneumococcal 23 valent vaccine  0.5 mL Intramuscular  Tomorrow-1000   Continuous Infusions: . sodium chloride 75 mL/hr at 10/15/14 1955   PRN Meds:.acetaminophen **OR** acetaminophen, ondansetron **OR** ondansetron (ZOFRAN) IV    PHYSICAL EXAM: Vital signs in last 24 hours: Filed Vitals:   10/15/14 1710 10/15/14 2219 10/16/14 0500 10/16/14 0512  BP:  121/73  132/72  Pulse:  84  102  Temp:  98.6 F (37 C)  101.4 F (38.6 C)  TempSrc:  Oral  Oral  Resp:  18    Height:  (1.651 m)     Weight:   100.4 kg (221 lb 5.5 oz)   SpO2:  100%  98%    Weight change:  Filed Weights   10/15/14 1012 10/16/14 0500  Weight: 90.719 kg (200 lb) 100.4 kg (221 lb 5.5 oz)   Body mass index is 36.83 kg/(m^2).   Gen Exam: Awake and alert with clear speech.   Neck: Supple, No JVD.   Chest: B/L Clear.   CVS: S1 S2 Regular, no murmurs.  Abdomen: soft, BS +, non tender, non distended. + Right CVA tenderness Extremities: no edema, lower extremities warm to touch. Neurologic: Non Focal.   Skin: No Rash.   Wounds: N/A.    Intake/Output from previous day:  Intake/Output Summary (Last 24 hours) at 10/16/14 1009 Last data filed at 10/16/14 0700  Gross per 24 hour  Intake    100 ml  Output    800 ml  Net   -700 ml  LAB RESULTS: CBC  Recent Labs Lab 10/15/14 1117 10/15/14 1828 10/16/14 0508  WBC 20.7* 17.4* 17.1*  HGB 13.7 12.1 11.3*  HCT 40.6 36.0 33.8*  PLT 160 124* 126*  MCV 86.8 87.4 87.1  MCH 29.3 29.4 29.1  MCHC 33.7 33.6 33.4  RDW 12.6 12.7 12.7  LYMPHSABS 2.5 2.4  --   MONOABS 1.8* 1.4*  --   EOSABS 0.0 0.0  --   BASOSABS 0.0 0.0  --     Chemistries   Recent Labs Lab 10/15/14 1117 10/15/14 1828 10/16/14 0508  NA 136 139 137  K 3.3* 2.8* 3.1*  CL 100* 104 102  CO2 25 27 25   GLUCOSE 108* 131* 98  BUN 15 14 11   CREATININE 1.37* 1.13* 1.03*  CALCIUM 8.8* 8.0* 7.7*  MG  --  1.7  --     CBG: No results for input(s): GLUCAP in the last 168 hours.  GFR Estimated Creatinine Clearance: 98.1 mL/min (by C-G  formula based on Cr of 1.03).  Coagulation profile  Recent Labs Lab 10/15/14 1828  INR 1.32    Cardiac Enzymes No results for input(s): CKMB, TROPONINI, MYOGLOBIN in the last 168 hours.  Invalid input(s): CK  Invalid input(s): POCBNP No results for input(s): DDIMER in the last 72 hours. No results for input(s): HGBA1C in the last 72 hours. No results for input(s): CHOL, HDL, LDLCALC, TRIG, CHOLHDL, LDLDIRECT in the last 72 hours.  Recent Labs  10/15/14 1828  TSH 0.414   No results for input(s): VITAMINB12, FOLATE, FERRITIN, TIBC, IRON, RETICCTPCT in the last 72 hours.  Recent Labs  10/15/14 1117  LIPASE 14*    Urine Studies No results for input(s): UHGB, CRYS in the last 72 hours.  Invalid input(s): UACOL, UAPR, USPG, UPH, UTP, UGL, UKET, UBIL, UNIT, UROB, ULEU, UEPI, UWBC, URBC, UBAC, CAST, UCOM, BILUA  MICROBIOLOGY: No results found for this or any previous visit (from the past 240 hour(s)).  RADIOLOGY STUDIES/RESULTS: Dg Chest 2 View  10/15/2014   CLINICAL DATA:  Right-sided chest pain and hematemesis for 1 day.  EXAM: CHEST  2 VIEW  COMPARISON:  04/18/2014 and prior chest radiographs dating back to 10/15/2008  FINDINGS: The cardiomediastinal silhouette is unremarkable.  There is no evidence of focal airspace disease, pulmonary edema, suspicious pulmonary nodule/mass, pleural effusion, or pneumothorax. No acute bony abnormalities are identified.  IMPRESSION: No active cardiopulmonary disease.   Electronically Signed   By: Harmon Pier M.D.   On: 10/15/2014 11:37   Ct Abdomen Pelvis W Contrast  10/15/2014   CLINICAL DATA:  Right side pain  EXAM: CT ABDOMEN AND PELVIS WITH CONTRAST  TECHNIQUE: Multidetector CT imaging of the abdomen and pelvis was performed using the standard protocol following bolus administration of intravenous contrast.  CONTRAST:  OMNIPAQUE IOHEXOL 300 MG/ML  SOLN  COMPARISON:  None.  FINDINGS: Lung bases are unremarkable. Sagittal images of  the spine shows mild degenerative changes lower thoracic spine. Liver, pancreas, spleen and adrenal glands are unremarkable. No calcified gallstones are noted within gallbladder. Small hiatal hernia. There is subtle mild right perinephric stranding. Subtle mild swelling of the right kidney. There is small focus of cortical decreased density in midpole medial aspect of the right kidney measures about 2.5 cm. Similar focus is noted in midpole laterally measures about 1.4 cm. Findings are highly suspicious for multifocal pyelonephritis. Clinical correlation is necessary. Abdominal aorta is unremarkable. There is a cyst within left kidney anteriorly measures 1.2 cm. No hydronephrosis or  hydroureter bilaterally.  Normal appendix no pericecal inflammation. No small bowel obstruction. Small amount of free simple fluid within pelvis posteriorly. The uterus and ovaries are unremarkable. Urinary bladder is unremarkable. No colitis or diverticulitis. No free abdominal air. No adenopathy.  IMPRESSION: 1. Probable right pyelonephritis as described above. Clinical correlation is necessary. 2. No small bowel or colonic obstruction. 3. Normal appendix. 4. Small free fluid noted within posterior pelvis. No ovarian or adnexal mass. Unremarkable uterus. 5. No small bowel obstruction. These results were called by telephone at the time of interpretation on 10/15/2014 at 12:34 pm to Dr. Danelle Berry , who verbally acknowledged these results.   Electronically Signed   By: Natasha Mead M.D.   On: 10/15/2014 12:34    Jeoffrey Massed, MD  [facility] Pager:[phone]  If 7PM-7AM, please contact night-coverage [email or web address] Password [email or web address] 10/16/2014, 10:09 AM   LOS: 1 day

## 2014-10-16 NOTE — Progress Notes (Signed)
MD Ghimire made aware face to face of low potassium during floor rounds.

## 2014-10-17 LAB — BASIC METABOLIC PANEL
Anion gap: 10 (ref 5–15)
BUN: 8 mg/dL (ref 6–20)
CO2: 22 mmol/L (ref 22–32)
Calcium: 7.8 mg/dL — ABNORMAL LOW (ref 8.9–10.3)
Chloride: 107 mmol/L (ref 101–111)
Creatinine, Ser: 0.75 mg/dL (ref 0.44–1.00)
GFR calc Af Amer: >60 mL/min (ref >60)
GFR calc non Af Amer: >60 mL/min (ref >60)
Glucose, Bld: 100 mg/dL — ABNORMAL HIGH (ref 65–99)
Potassium: 3.5 mmol/L (ref 3.5–5.1)
Sodium: 139 mmol/L (ref 135–145)

## 2014-10-17 LAB — GLUCOSE, CAPILLARY: Glucose-Capillary: 89 mg/dL (ref 65–99)

## 2014-10-17 MED ORDER — OXYCODONE-ACETAMINOPHEN 5-325 MG PO TABS: 1.0000 | ORAL_TABLET | ORAL | Status: DC | PRN

## 2014-10-17 MED ORDER — KETOROLAC TROMETHAMINE 30 MG/ML IJ SOLN
30.0000 mg | Freq: Once | INTRAMUSCULAR | Status: AC
  Administered 2014-10-17: 30 mg via INTRAVENOUS
  Filled 2014-10-17: qty 1

## 2014-10-17 MED ORDER — OXYCODONE-ACETAMINOPHEN 5-325 MG PO TABS
2.0000 | ORAL_TABLET | Freq: Once | ORAL | Status: AC
  Administered 2014-10-17: 2 via ORAL
  Filled 2014-10-17: qty 2

## 2014-10-17 NOTE — Progress Notes (Signed)
PATIENT DETAILS Name: Betty Dean Age: 24 y.o. Sex: female Date of Birth: Aug 18, 1990 Admit Date: 10/15/2014 Admitting Physician Alison Murray, MD ZOX:[id number], PA-C  Subjective: Right flank pain much improved.Afebrile for past 24 hours. Feels much better than the past few days  Assessment/Plan: Principal Problem:  Acute pyelonephritis with sepsis: Continue with IV Rocephin, improved with decreasing leukocytosis, and is now afebrile. Blood cultures neg so far, unfortunately urine cultures not sent on admission-done on 8/27-pending. Follow clinical course-but suspect if clinical improvement continues home in am.  Active Problems: Hypokalemia: Secondary to GI loss, repleted   AKI: Secondary to prerenal azotemia from vomiting. Resolved with IVF.   History of migraine: Continue Topamax  Disposition: Remain inpatient-suspect home 8/29  Antimicrobial agents  See below  Anti-infectives    Start     Dose/Rate Route Frequency Ordered Stop   10/16/14 1500  cefTRIAXone (ROCEPHIN) 1 g in dextrose 5 % 50 mL IVPB     1 g 100 mL/hr over 30 Minutes Intravenous Every 24 hours 10/15/14 1554     10/15/14 1515  cefTRIAXone (ROCEPHIN) 2 g in dextrose 5 % 50 mL IVPB  Status:  Discontinued     2 g 100 mL/hr over 30 Minutes Intravenous  Once 10/15/14 1502 10/15/14 1554   10/15/14 1245  cefTRIAXone (ROCEPHIN) 1 g in dextrose 5 % 50 mL IVPB     1 g 100 mL/hr over 30 Minutes Intravenous  Once 10/15/14 1237 10/15/14 1522      DVT Prophylaxis: Prophylactic Lovenox  Code Status: Full code   Family Communication None at bedside  Procedures: None  CONSULTS:  None  Time spent 25 minutes-Greater than 50% of this time was spent in counseling, explanation of diagnosis, planning of further management, and coordination of care.  MEDICATIONS: Scheduled Meds: . cefTRIAXone (ROCEPHIN)  IV  1 g Intravenous Q24H  . enoxaparin (LOVENOX) injection  40 mg Subcutaneous Q24H   . ferrous sulfate  325 mg Oral QHS  . Influenza vac split quadrivalent PF  0.5 mL Intramuscular Tomorrow-1000  . pneumococcal 23 valent vaccine  0.5 mL Intramuscular Tomorrow-1000  . topiramate  50 mg Oral BID   Continuous Infusions: . sodium chloride 75 mL/hr at 10/17/14 0344   PRN Meds:.acetaminophen **OR** acetaminophen, ondansetron **OR** ondansetron (ZOFRAN) IV, SUMAtriptan    PHYSICAL EXAM: Vital signs in last 24 hours: Filed Vitals:   10/16/14 0512 10/16/14 1309 10/16/14 2200 10/17/14 0557  BP: 132/72 119/76 129/84 111/55  Pulse: 102 94 77 74  Temp: 101.4 F (38.6 C) 99.9 F (37.7 C) 99.5 F (37.5 C) 98 F (36.7 C)  TempSrc: Oral Oral Oral Oral  Resp:  Height:      Weight:    100.5 kg (221 lb 9 oz)  SpO2: 98% 97% 100% 97%    Weight change: 9.781 kg (21 lb 9 oz) Filed Weights   10/15/14 1012 10/16/14 0500 10/17/14 0557  Weight: 90.719 kg (200 lb) 100.4 kg (221 lb 5.5 oz) 100.5 kg (221 lb 9 oz)   Body mass index is 36.87 kg/(m^2).   Gen Exam: Awake and alert with clear speech.   Neck: Supple, No JVD.   Chest: B/L Clear.   CVS: S1 S2 Regular, no murmurs.  Abdomen: soft, BS +, non tender, non distended. + Right CVA tenderness Extremities: no edema, lower extremities warm to touch. Neurologic: Non Focal.   Skin:  No Rash.   Wounds: N/A.    Intake/Output from previous day:  Intake/Output Summary (Last 24 hours) at 10/17/14 1013 Last data filed at 10/17/14 0500  Gross per 24 hour  Intake    240 ml  Output   3150 ml  Net  -2910 ml     LAB RESULTS: CBC  Recent Labs Lab 10/15/14 1117 10/15/14 1828 10/16/14 0508  WBC 20.7* 17.4* 17.1*  HGB 13.7 12.1 11.3*  HCT 40.6 36.0 33.8*  PLT 160 124* 126*  MCV 86.8 87.4 87.1  MCH 29.3 29.4 29.1  MCHC 33.7 33.6 33.4  RDW 12.6 12.7 12.7  LYMPHSABS 2.5 2.4  --   MONOABS 1.8* 1.4*  --   EOSABS 0.0 0.0  --   BASOSABS 0.0 0.0  --     Chemistries   Recent Labs Lab 10/15/14 1117 10/15/14 1828  10/16/14 0508 10/17/14 0535  NA 136 139 137 139  K 3.3* 2.8* 3.1* 3.5  CL 100* 104 102 107  CO2 25 27 25 22   GLUCOSE 108* 131* 98 100*  BUN 15 14 11 8   CREATININE 1.37* 1.13* 1.03* 0.75  CALCIUM 8.8* 8.0* 7.7* 7.8*  MG  --  1.7  --   --     CBG:  Recent Labs Lab 10/16/14 0801 10/17/14 0751  GLUCAP 93 89    GFR Estimated Creatinine Clearance: 126.3 mL/min (by C-G formula based on Cr of 0.75).  Coagulation profile  Recent Labs Lab 10/15/14 1828  INR 1.32    Cardiac Enzymes No results for input(s): CKMB, TROPONINI, MYOGLOBIN in the last 168 hours.  Invalid input(s): CK  Invalid input(s): POCBNP No results for input(s): DDIMER in the last 72 hours. No results for input(s): HGBA1C in the last 72 hours. No results for input(s): CHOL, HDL, LDLCALC, TRIG, CHOLHDL, LDLDIRECT in the last 72 hours.  Recent Labs  10/15/14 1828  TSH 0.414   No results for input(s): VITAMINB12, FOLATE, FERRITIN, TIBC, IRON, RETICCTPCT in the last 72 hours.  Recent Labs  10/15/14 1117  LIPASE 14*    Urine Studies No results for input(s): UHGB, CRYS in the last 72 hours.  Invalid input(s): UACOL, UAPR, USPG, UPH, UTP, UGL, UKET, UBIL, UNIT, UROB, ULEU, UEPI, UWBC, URBC, UBAC, CAST, UCOM, BILUA  MICROBIOLOGY: Recent Results (from the past 240 hour(s))  Culture, blood (x 2)     Status: None (Preliminary result)   Collection Time: 10/15/14  3:59 PM  Result Value Ref Range Status   Specimen Description BLOOD LEFT ANTECUBITAL  Final   Special Requests BOTTLES DRAWN AEROBIC AND ANAEROBIC 5CC  Final   Culture NO GROWTH < 24 HOURS  Final   Report Status PENDING  Incomplete  Culture, blood (x 2)     Status: None (Preliminary result)   Collection Time: 10/15/14  4:04 PM  Result Value Ref Range Status   Specimen Description BLOOD LEFT HAND  Final   Special Requests BOTTLES DRAWN AEROBIC AND ANAEROBIC 5CC  Final   Culture NO GROWTH < 24 HOURS  Final   Report Status PENDING  Incomplete     RADIOLOGY STUDIES/RESULTS: Dg Chest 2 View  10/15/2014   CLINICAL DATA:  Right-sided chest pain and hematemesis for 1 day.  EXAM: CHEST  2 VIEW  COMPARISON:  04/18/2014 and prior chest radiographs dating back to 10/15/2008  FINDINGS: The cardiomediastinal silhouette is unremarkable.  There is no evidence of focal airspace disease, pulmonary edema, suspicious pulmonary nodule/mass, pleural effusion, or pneumothorax. No acute  bony abnormalities are identified.  IMPRESSION: No active cardiopulmonary disease.   Electronically Signed   By: Harmon Pier M.D.   On: 10/15/2014 11:37   Ct Abdomen Pelvis W Contrast  10/15/2014   CLINICAL DATA:  Right side pain  EXAM: CT ABDOMEN AND PELVIS WITH CONTRAST  TECHNIQUE: Multidetector CT imaging of the abdomen and pelvis was performed using the standard protocol following bolus administration of intravenous contrast.  CONTRAST:  OMNIPAQUE IOHEXOL 300 MG/ML  SOLN  COMPARISON:  None.  FINDINGS: Lung bases are unremarkable. Sagittal images of the spine shows mild degenerative changes lower thoracic spine. Liver, pancreas, spleen and adrenal glands are unremarkable. No calcified gallstones are noted within gallbladder. Small hiatal hernia. There is subtle mild right perinephric stranding. Subtle mild swelling of the right kidney. There is small focus of cortical decreased density in midpole medial aspect of the right kidney measures about 2.5 cm. Similar focus is noted in midpole laterally measures about 1.4 cm. Findings are highly suspicious for multifocal pyelonephritis. Clinical correlation is necessary. Abdominal aorta is unremarkable. There is a cyst within left kidney anteriorly measures 1.2 cm. No hydronephrosis or hydroureter bilaterally.  Normal appendix no pericecal inflammation. No small bowel obstruction. Small amount of free simple fluid within pelvis posteriorly. The uterus and ovaries are unremarkable. Urinary bladder is unremarkable. No colitis or  diverticulitis. No free abdominal air. No adenopathy.  IMPRESSION: 1. Probable right pyelonephritis as described above. Clinical correlation is necessary. 2. No small bowel or colonic obstruction. 3. Normal appendix. 4. Small free fluid noted within posterior pelvis. No ovarian or adnexal mass. Unremarkable uterus. 5. No small bowel obstruction. These results were called by telephone at the time of interpretation on 10/15/2014 at 12:34 pm to Dr. Danelle Berry , who verbally acknowledged these results.   Electronically Signed   By: Natasha Mead M.D.   On: 10/15/2014 12:34    Jeoffrey Massed, MD  [facility] Pager:[phone]  If 7PM-7AM, please contact night-coverage [email or web address] Password [email or web address] 10/17/2014, 10:13 AM   LOS: 2 days

## 2014-10-17 NOTE — Progress Notes (Signed)
Patient complaining of right flank pain and requesting something stronger than tylenol.  Placed call to physician, will continue to monitor.  Macarthur Critchley, RN

## 2014-10-18 ENCOUNTER — Telehealth: Payer: Self-pay

## 2014-10-18 LAB — URINE CULTURE

## 2014-10-18 LAB — GLUCOSE, CAPILLARY: Glucose-Capillary: 85 mg/dL (ref 65–99)

## 2014-10-18 MED ORDER — CEPHALEXIN 500 MG PO CAPS: 500.0000 mg | ORAL_CAPSULE | Freq: Three times a day (TID) | ORAL | Status: DC

## 2014-10-18 NOTE — Telephone Encounter (Signed)
Call received from Letha Cape, CM requesting a hospital follow up appointment for the patient.  Informed her that the patient has an appointment scheduled with Dr Arlyce Dice on 10/27/14.  D. Ladona Ridgel stated that she checked with the patient and she does not see Dr Arlyce Dice and an appointment was then scheduled for her at the [facility] for 10/27/14 @ 1500 w/ Dr Venetia Night.   The information was added to the AVS and an update was provided to D. Ladona Ridgel. CM.

## 2014-10-18 NOTE — Discharge Instructions (Signed)
Follow with Primary MD  Mady Gemma, PA-C as instructed your Hospitalist MD  Please get a complete blood count and chemistry panel checked by your Primary MD at your next visit, and again as instructed by your Primary MD.  Please note-year urine and blood cultures are negative so far-please have your primary M.D. follow till it is final.   Get Medicines reviewed and adjusted. Please take all your medications with you for your next visit with your Primary MD  Please request your Primary MD to go over all hospital tests and procedure/radiological results at the follow up, please ask your Primary MD to get all Hospital records sent to his/her office.  If you experience worsening of your admission symptoms, develop shortness of breath, life threatening emergency, suicidal or homicidal thoughts you must seek medical attention immediately by calling 911 or calling your MD immediately  if symptoms less severe.  You must read complete instructions/literature along with all the possible adverse reactions/side effects for all the Medicines you take and that have been prescribed to you. Take any new Medicines after you have completely understood and accpet all the possible adverse reactions/side effects.   Do not drive when taking Pain medications.   Do not take more than prescribed Pain, Sleep and Anxiety Medications  Special Instructions: If you have smoked or chewed Tobacco  in the last 2 yrs please stop smoking, stop any regular Alcohol  and or any Recreational drug use.  Wear Seat belts while driving.  Please note  You were cared for by a hospitalist during your hospital stay. Once you are discharged, your primary care physician will handle any further medical issues. Please note that NO REFILLS for any discharge medications will be authorized once you are discharged, as it is imperative that you return to your primary care physician (or establish a relationship with a primary care physician  if you do not have one) for your aftercare needs so that they can reassess your need for medications and monitor your lab values.

## 2014-10-18 NOTE — Discharge Summary (Signed)
PATIENT DETAILS Name: Betty Dean Age: 24 y.o. Sex: female Date of Birth: [birth year] MRN: [id number]. Admitting Physician: Alison Murray, MD JXB:[id number], PA-C  Admit Date: 10/15/2014 Discharge date: 10/18/2014  Recommendations for Outpatient Follow-up:  Urine and blood cultures are negative so far-please  follow till it is final. Check CBC and chemistries at next visit  PRIMARY DISCHARGE DIAGNOSIS:  Principal Problem:   Acute pyelonephritis Active Problems:   Sepsis   Leukocytosis   Hypokalemia   Anemia, iron deficiency   Acute renal failure      PAST MEDICAL HISTORY: Past Medical History  Diagnosis Date  . Anxious mood   . Polycystic ovarian syndrome   . GERD (gastroesophageal reflux disease)   . Stomach ulcer   . Migraine     "maybe once/wk" (10/15/2014)  . Depression     DISCHARGE MEDICATIONS: Current Discharge Medication List    START taking these medications   Details  cephALEXin (KEFLEX) 500 MG capsule Take 1 capsule (500 mg total) by mouth 3 (three) times daily. Qty: 24 capsule, Refills: 0      CONTINUE these medications which have NOT CHANGED   Details  acetaminophen (TYLENOL) 325 MG tablet Take 650 mg by mouth every 6 (six) hours as needed.    Calcium Carbonate Antacid (TUMS PO) Take 2 tablets by mouth as needed (for acid reflux).     Diphenhydramine-APAP, sleep, (GOODYS PM PO) Take 1 Package by mouth.    ferrous sulfate 325 (65 FE) MG tablet Take 325 mg by mouth at bedtime.     LORazepam (ATIVAN) 0.5 MG tablet Take 0.5 mg by mouth 2 (two) times daily as needed for anxiety.    Multiple Vitamin (MULTIVITAMIN WITH MINERALS) TABS tablet Take 1 tablet by mouth daily.    valACYclovir (VALTREX) 500 MG tablet Take 500 mg by mouth at bedtime.  Refills: 11    rizatriptan (MAXALT-MLT) 10 MG disintegrating tablet Take 1 tablet (10 mg total) by mouth as needed for migraine. May repeat in 2 hours if needed Qty: 9 tablet, Refills: 11      topiramate (TOPAMAX) 50 MG tablet Take 1 tablet (50 mg total) by mouth 2 (two) times daily. Qty: 60 tablet, Refills: 12      STOP taking these medications     ibuprofen (ADVIL,MOTRIN) 800 MG tablet         ALLERGIES:   Allergies  Allergen Reactions  . Shrimp [Shellfish Allergy] Itching    Rash / Swelling  . Latex Itching and Rash    BRIEF HPI:  See H&P, Labs, Consult and Test reports for all details in brief, patient was admitted for evaluation of right flank pain and fever. Patient was found to have pyelonephritis, and admitted for further evaluation and treatment  CONSULTATIONS:   None  PERTINENT RADIOLOGIC STUDIES: Dg Chest 2 View  10/15/2014   CLINICAL DATA:  Right-sided chest pain and hematemesis for 1 day.  EXAM: CHEST  2 VIEW  COMPARISON:  04/18/2014 and prior chest radiographs dating back to 10/15/2008  FINDINGS: The cardiomediastinal silhouette is unremarkable.  There is no evidence of focal airspace disease, pulmonary edema, suspicious pulmonary nodule/mass, pleural effusion, or pneumothorax. No acute bony abnormalities are identified.  IMPRESSION: No active cardiopulmonary disease.   Electronically Signed   By: Harmon Pier M.D.   On: 10/15/2014 11:37   Ct Abdomen Pelvis W Contrast  10/15/2014   CLINICAL DATA:  Right side pain  EXAM: CT ABDOMEN AND PELVIS WITH CONTRAST  TECHNIQUE: Multidetector CT imaging of the abdomen and pelvis was performed using the standard protocol following bolus administration of intravenous contrast.  CONTRAST:  OMNIPAQUE IOHEXOL 300 MG/ML  SOLN  COMPARISON:  None.  FINDINGS: Lung bases are unremarkable. Sagittal images of the spine shows mild degenerative changes lower thoracic spine. Liver, pancreas, spleen and adrenal glands are unremarkable. No calcified gallstones are noted within gallbladder. Small hiatal hernia. There is subtle mild right perinephric stranding. Subtle mild swelling of the right kidney. There is small focus of cortical  decreased density in midpole medial aspect of the right kidney measures about 2.5 cm. Similar focus is noted in midpole laterally measures about 1.4 cm. Findings are highly suspicious for multifocal pyelonephritis. Clinical correlation is necessary. Abdominal aorta is unremarkable. There is a cyst within left kidney anteriorly measures 1.2 cm. No hydronephrosis or hydroureter bilaterally.  Normal appendix no pericecal inflammation. No small bowel obstruction. Small amount of free simple fluid within pelvis posteriorly. The uterus and ovaries are unremarkable. Urinary bladder is unremarkable. No colitis or diverticulitis. No free abdominal air. No adenopathy.  IMPRESSION: 1. Probable right pyelonephritis as described above. Clinical correlation is necessary. 2. No small bowel or colonic obstruction. 3. Normal appendix. 4. Small free fluid noted within posterior pelvis. No ovarian or adnexal mass. Unremarkable uterus. 5. No small bowel obstruction. These results were called by telephone at the time of interpretation on 10/15/2014 at 12:34 pm to Dr. Danelle Berry , who verbally acknowledged these results.   Electronically Signed   By: Natasha Mead M.D.   On: 10/15/2014 12:34     PERTINENT LAB RESULTS: CBC:  Recent Labs  10/15/14 1828 10/16/14 0508  WBC 17.4* 17.1*  HGB 12.1 11.3*  HCT 36.0 33.8*  PLT 124* 126*   CMET CMP     Component Value Date/Time   NA 139 10/17/2014 0535   K 3.5 10/17/2014 0535   CL 107 10/17/2014 0535   CO2 22 10/17/2014 0535   GLUCOSE 100* 10/17/2014 0535   BUN 8 10/17/2014 0535   CREATININE 0.75 10/17/2014 0535   CALCIUM 7.8* 10/17/2014 0535   PROT 6.1* 10/16/2014 0508   ALBUMIN 3.1* 10/16/2014 0508   AST 17 10/16/2014 0508   ALT 13* 10/16/2014 0508   ALKPHOS 58 10/16/2014 0508   BILITOT 0.5 10/16/2014 0508   GFRNONAA >60 10/17/2014 0535   GFRAA >60 10/17/2014 0535    GFR Estimated Creatinine Clearance: 126.3 mL/min (by C-G formula based on Cr of  0.75).  Recent Labs  10/15/14 1117  LIPASE 14*   No results for input(s): CKTOTAL, CKMB, CKMBINDEX, TROPONINI in the last 72 hours. Invalid input(s): POCBNP No results for input(s): DDIMER in the last 72 hours. No results for input(s): HGBA1C in the last 72 hours. No results for input(s): CHOL, HDL, LDLCALC, TRIG, CHOLHDL, LDLDIRECT in the last 72 hours.  Recent Labs  10/15/14 1828  TSH 0.414   No results for input(s): VITAMINB12, FOLATE, FERRITIN, TIBC, IRON, RETICCTPCT in the last 72 hours. Coags:  Recent Labs  10/15/14 1828  INR 1.32   Microbiology: Recent Results (from the past 240 hour(s))  Culture, blood (x 2)     Status: None (Preliminary result)   Collection Time: 10/15/14  3:59 PM  Result Value Ref Range Status   Specimen Description BLOOD LEFT ANTECUBITAL  Final   Special Requests BOTTLES DRAWN AEROBIC AND ANAEROBIC 5CC  Final   Culture NO GROWTH 2 DAYS  Final   Report Status PENDING  Incomplete  Culture, blood (x 2)     Status: None (Preliminary result)   Collection Time: 10/15/14  4:04 PM  Result Value Ref Range Status   Specimen Description BLOOD LEFT HAND  Final   Special Requests BOTTLES DRAWN AEROBIC AND ANAEROBIC 5CC  Final   Culture NO GROWTH 2 DAYS  Final   Report Status PENDING  Incomplete  Urine culture     Status: None (Preliminary result)   Collection Time: 10/16/14 12:09 PM  Result Value Ref Range Status   Specimen Description URINE, CLEAN CATCH  Final   Special Requests NONE  Final   Culture CULTURE REINCUBATED FOR BETTER GROWTH  Final   Report Status PENDING  Incomplete     BRIEF HOSPITAL COURSE:  Acute pyelonephritis with sepsis: Managed with IV fluids and IV Rocephin. Sepsis pathophysiology has resolved. Significantly improved, no longer having right CVA tenderness. Leukocytosis down trending, patient afebrile for close to 2 days. Since improved, stable for discharge-transition to Keflex on discharge. Blood cultures neg so far,  unfortunately urine cultures not sent on admission-done on 8/27-pending-but negative so far. Patient instructed to continue with antibiotics, follow-up with PCP to follow final culture results. She was also instructed that likely her pain recurs or she has fever, she should come back to the emergency room for further evaluation.  Active Problems: Hypokalemia: Secondary to GI loss, repleted   AKI: Secondary to prerenal azotemia from vomiting. Resolved with IVF.   History of migraine: Continue Topamax   TODAY-DAY OF DISCHARGE:  Subjective:   Betty Dean today has no headache,no chest abdominal pain,no new weakness tingling or numbness, feels much better wants to go home today.   Objective:   Blood pressure 116/63, pulse 70, temperature 98.4 F (36.9 C), temperature source Oral, resp. rate 18, height 5\' 5"  (1.651 m), weight 100.4 kg (221 lb 5.5 oz), SpO2 100 %.  Intake/Output Summary (Last 24 hours) at 10/18/14 1014 Last data filed at 10/18/14 0600  Gross per 24 hour  Intake   5155 ml  Output    800 ml  Net   4355 ml   Filed Weights   10/16/14 0500 10/17/14 0557 10/18/14 0553  Weight: 100.4 kg (221 lb 5.5 oz) 100.5 kg (221 lb 9 oz) 100.4 kg (221 lb 5.5 oz)    Exam Awake Alert, Oriented *3, No new F.N deficits, Normal affect [place].AT,PERRAL Supple Neck,No JVD, No cervical lymphadenopathy appriciated.  Symmetrical Chest wall movement, Good air movement bilaterally, CTAB RRR,No Gallops,Rubs or new Murmurs, No Parasternal Heave +ve B.Sounds, Abd Soft, Non tender, No organomegaly appriciated, No rebound -guarding or rigidity. No CVA tenderness No Cyanosis, Clubbing or edema, No new Rash or bruise  DISCHARGE CONDITION: Stable  DISPOSITION: Home  DISCHARGE INSTRUCTIONS:    Activity:  As tolerated   Follow with Primary MD  Mady Gemma, PA-C as instructed your Hospitalist MD  Please get a complete blood count and chemistry panel checked by your Primary MD at your next  visit, and again as instructed by your Primary MD.  Please note-year urine and blood cultures are negative so far-please have your primary M.D. follow till it is final.   Get Medicines reviewed and adjusted. Please take all your medications with you for your next visit with your Primary MD  Please request your Primary MD to go over all hospital tests and procedure/radiological results at the follow up, please ask your Primary MD to get all Hospital records sent to his/her office.  If you experience worsening  of your admission symptoms, develop shortness of breath, life threatening emergency, suicidal or homicidal thoughts you must seek medical attention immediately by calling 911 or calling your MD immediately  if symptoms less severe.  You must read complete instructions/literature along with all the possible adverse reactions/side effects for all the Medicines you take and that have been prescribed to you. Take any new Medicines after you have completely understood and accpet all the possible adverse reactions/side effects.   Do not drive when taking Pain medications.   Do not take more than prescribed Pain, Sleep and Anxiety Medications  Special Instructions: If you have smoked or chewed Tobacco  in the last 2 yrs please stop smoking, stop any regular Alcohol  and or any Recreational drug use.  Wear Seat belts while driving.  Please note  You were cared for by a hospitalist during your hospital stay. Once you are discharged, your primary care physician will handle any further medical issues. Please note that NO REFILLS for any discharge medications will be authorized once you are discharged, as it is imperative that you return to your primary care physician (or establish a relationship with a primary care physician if you do not have one) for your aftercare needs so that they can reassess your need for medications and monitor your lab values.   Diet recommendation: Regular  Diet  Discharge Instructions    Call MD for:  severe uncontrolled pain    Complete by:  As directed      Call MD for:  temperature >100.4    Complete by:  As directed      Diet general    Complete by:  As directed      Increase activity slowly    Complete by:  As directed            Follow-up Information    Follow up with KAPLAN,KRISTEN, PA-C On 10/27/2014.   Specialty:  Family Medicine   Why:  Wednesday 10/27/14 @ 09:45am    Contact information:   [address] [place] [address] [phone]       Total Time spent on discharge equals 25  minutes.  SignedJeoffrey Massed 10/18/2014 10:14 AM

## 2014-10-18 NOTE — Care Management Note (Signed)
Case Management Note  Patient Details  Name: Betty Dean MRN: [id number] Date of Birth: [birth year]  Subjective/Objective:   Patient is from home , she is indep, she has transportation at dc.  NCM gave patient brochure for hospital follow up apt and explained that she can get her meds from the pharmacy there for $4 or $10.  Explained also that to go as walk in on Thursdays to apply for orange card.                 Action/Plan:   Expected Discharge Date:                  Expected Discharge Plan:  Home/Self Care  In-House Referral:     Discharge planning Services  CM Consult, [facility], Follow-up appt scheduled  Post Acute Care Choice:    Choice offered to:     DME Arranged:    DME Agency:     HH Arranged:    HH Agency:     Status of Service:  Completed, signed off  Medicare Important Message Given:    Date Medicare IM Given:    Medicare IM give by:    Date Additional Medicare IM Given:    Additional Medicare Important Message give by:     If discussed at Long Length of Stay Meetings, dates discussed:    Additional Comments:  Leone Haven, RN 10/18/2014, 12:06 PM

## 2014-10-20 LAB — CULTURE, BLOOD (ROUTINE X 2)
Culture: NO GROWTH
Culture: NO GROWTH

## 2014-10-27 ENCOUNTER — Inpatient Hospital Stay: Payer: Self-pay | Admitting: Family Medicine

## 2015-09-14 ENCOUNTER — Encounter (HOSPITAL_COMMUNITY): Payer: Self-pay | Admitting: Emergency Medicine

## 2015-09-14 ENCOUNTER — Emergency Department (HOSPITAL_COMMUNITY)
Admission: EM | Admit: 2015-09-14 | Discharge: 2015-09-14 | Disposition: A | Discharge status: Home / Self Care | Payer: Medicaid Other | Attending: Dermatology | Admitting: Dermatology

## 2015-09-14 DIAGNOSIS — Z5321 Procedure and treatment not carried out due to patient leaving prior to being seen by health care provider: Secondary | ICD-10-CM | POA: Insufficient documentation

## 2015-09-14 DIAGNOSIS — F329 Major depressive disorder, single episode, unspecified: Secondary | ICD-10-CM | POA: Insufficient documentation

## 2015-09-14 DIAGNOSIS — G43909 Migraine, unspecified, not intractable, without status migrainosus: Secondary | ICD-10-CM | POA: Insufficient documentation

## 2015-09-14 DIAGNOSIS — Z79891 Long term (current) use of opiate analgesic: Secondary | ICD-10-CM | POA: Insufficient documentation

## 2015-09-14 DIAGNOSIS — F17210 Nicotine dependence, cigarettes, uncomplicated: Secondary | ICD-10-CM | POA: Insufficient documentation

## 2015-09-14 NOTE — ED Triage Notes (Signed)
Patient states that she has had intermittent migraines over the past couple months.  The past couple of days has had constant migraine. Patient states that she has had n/v.

## 2015-09-14 NOTE — ED Notes (Signed)
No answer from lobby  

## 2017-02-08 ENCOUNTER — Encounter (HOSPITAL_BASED_OUTPATIENT_CLINIC_OR_DEPARTMENT_OTHER): Payer: Self-pay | Admitting: Emergency Medicine

## 2017-02-08 ENCOUNTER — Emergency Department (HOSPITAL_BASED_OUTPATIENT_CLINIC_OR_DEPARTMENT_OTHER): Payer: No Typology Code available for payment source

## 2017-02-08 ENCOUNTER — Emergency Department (HOSPITAL_BASED_OUTPATIENT_CLINIC_OR_DEPARTMENT_OTHER)
Admission: EM | Admit: 2017-02-08 | Discharge: 2017-02-08 | Disposition: A | Discharge status: Home / Self Care | Payer: No Typology Code available for payment source | Attending: Emergency Medicine | Admitting: Emergency Medicine

## 2017-02-08 ENCOUNTER — Other Ambulatory Visit: Payer: Self-pay

## 2017-02-08 DIAGNOSIS — Y998 Other external cause status: Secondary | ICD-10-CM | POA: Diagnosis not present

## 2017-02-08 DIAGNOSIS — F17210 Nicotine dependence, cigarettes, uncomplicated: Secondary | ICD-10-CM | POA: Diagnosis not present

## 2017-02-08 DIAGNOSIS — R519 Headache, unspecified: Secondary | ICD-10-CM

## 2017-02-08 DIAGNOSIS — Z91040 Latex allergy status: Secondary | ICD-10-CM | POA: Diagnosis not present

## 2017-02-08 DIAGNOSIS — Y939 Activity, unspecified: Secondary | ICD-10-CM | POA: Diagnosis not present

## 2017-02-08 DIAGNOSIS — R55 Syncope and collapse: Secondary | ICD-10-CM | POA: Diagnosis not present

## 2017-02-08 DIAGNOSIS — Y92410 Unspecified street and highway as the place of occurrence of the external cause: Secondary | ICD-10-CM | POA: Diagnosis not present

## 2017-02-08 DIAGNOSIS — R51 Headache: Secondary | ICD-10-CM | POA: Insufficient documentation

## 2017-02-08 LAB — COMPREHENSIVE METABOLIC PANEL
ALT: 17 U/L (ref 14–54)
AST: 16 U/L (ref 15–41)
Albumin: 4.1 g/dL (ref 3.5–5.0)
Alkaline Phosphatase: 69 U/L (ref 38–126)
Anion gap: 7 (ref 5–15)
BUN: 11 mg/dL (ref 6–20)
CO2: 30 mmol/L (ref 22–32)
Calcium: 8.3 mg/dL — ABNORMAL LOW (ref 8.9–10.3)
Chloride: 101 mmol/L (ref 101–111)
Creatinine, Ser: 0.74 mg/dL (ref 0.44–1.00)
GFR calc Af Amer: >60 mL/min (ref >60)
GFR calc non Af Amer: >60 mL/min (ref >60)
Glucose, Bld: 97 mg/dL (ref 65–99)
Potassium: 3.1 mmol/L — ABNORMAL LOW (ref 3.5–5.1)
Sodium: 138 mmol/L (ref 135–145)
Total Bilirubin: 0.4 mg/dL (ref 0.3–1.2)
Total Protein: 7.3 g/dL (ref 6.5–8.1)

## 2017-02-08 LAB — CBC WITH DIFFERENTIAL/PLATELET
Basophils Absolute: 0.1 10*3/uL (ref 0.0–0.1)
Basophils Relative: 1 %
Eosinophils Absolute: 0.2 10*3/uL (ref 0.0–0.7)
Eosinophils Relative: 2 %
HCT: 40.8 % (ref 36.0–46.0)
Hemoglobin: 13.5 g/dL (ref 12.0–15.0)
Lymphocytes Relative: 30 %
Lymphs Abs: 3.2 10*3/uL (ref 0.7–4.0)
MCH: 28.8 pg (ref 26.0–34.0)
MCHC: 33.1 g/dL (ref 30.0–36.0)
MCV: 87.2 fL (ref 78.0–100.0)
Monocytes Absolute: 0.7 10*3/uL (ref 0.1–1.0)
Monocytes Relative: 6 %
Neutro Abs: 6.6 10*3/uL (ref 1.7–7.7)
Neutrophils Relative %: 61 %
Platelets: 210 10*3/uL (ref 150–400)
RBC: 4.68 MIL/uL (ref 3.87–5.11)
RDW: 12.8 % (ref 11.5–15.5)
WBC: 10.7 10*3/uL — ABNORMAL HIGH (ref 4.0–10.5)

## 2017-02-08 LAB — PREGNANCY, URINE: Preg Test, Ur: NEGATIVE

## 2017-02-08 LAB — URINALYSIS, ROUTINE W REFLEX MICROSCOPIC
Bilirubin Urine: NEGATIVE
Glucose, UA: NEGATIVE mg/dL
Hgb urine dipstick: NEGATIVE
Ketones, ur: NEGATIVE mg/dL
Leukocytes, UA: NEGATIVE
Nitrite: NEGATIVE
Protein, ur: NEGATIVE mg/dL
Specific Gravity, Urine: 1.01 (ref 1.005–1.030)
pH: 8 (ref 5.0–8.0)

## 2017-02-08 MED ORDER — SODIUM CHLORIDE 0.9 % IV BOLUS (SEPSIS)
1000.0000 mL | Freq: Once | INTRAVENOUS | Status: AC
  Administered 2017-02-08: 1000 mL via INTRAVENOUS

## 2017-02-08 MED ORDER — PROCHLORPERAZINE EDISYLATE 5 MG/ML IJ SOLN
10.0000 mg | Freq: Once | INTRAMUSCULAR | Status: AC
  Administered 2017-02-08: 10 mg via INTRAVENOUS
  Filled 2017-02-08: qty 2

## 2017-02-08 MED ORDER — DIPHENHYDRAMINE HCL 50 MG/ML IJ SOLN
25.0000 mg | Freq: Once | INTRAMUSCULAR | Status: AC
  Administered 2017-02-08: 25 mg via INTRAVENOUS
  Filled 2017-02-08: qty 1

## 2017-02-08 MED ORDER — POTASSIUM CHLORIDE CRYS ER 20 MEQ PO TBCR
40.0000 meq | EXTENDED_RELEASE_TABLET | Freq: Once | ORAL | Status: AC
  Administered 2017-02-08: 40 meq via ORAL
  Filled 2017-02-08: qty 2
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
## 2017-02-08 NOTE — ED Notes (Signed)
Patient transported to CT 

## 2017-02-08 NOTE — ED Notes (Signed)
Pt and family understood dc material. NAD noted. Work excuse given at [employer]
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
## 2019-02-20 HISTORY — PX: TUBAL LIGATION: SHX77
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
## 2020-02-03 LAB — HM MAMMOGRAPHY

## 2020-02-20 HISTORY — PX: CYSTOSCOPY: SUR368

## 2020-02-20 HISTORY — PX: CHOLECYSTECTOMY: SHX55

## 2020-12-12 DIAGNOSIS — H6992 Unspecified Eustachian tube disorder, left ear: Secondary | ICD-10-CM | POA: Diagnosis not present

## 2021-02-08 DIAGNOSIS — H6123 Impacted cerumen, bilateral: Secondary | ICD-10-CM | POA: Diagnosis not present

## 2021-02-08 DIAGNOSIS — H903 Sensorineural hearing loss, bilateral: Secondary | ICD-10-CM | POA: Diagnosis not present

## 2021-02-08 DIAGNOSIS — H6980 Other specified disorders of Eustachian tube, unspecified ear: Secondary | ICD-10-CM | POA: Diagnosis not present

## 2021-03-21 DIAGNOSIS — R42 Dizziness and giddiness: Secondary | ICD-10-CM | POA: Diagnosis not present

## 2021-05-15 DIAGNOSIS — M25551 Pain in right hip: Secondary | ICD-10-CM | POA: Diagnosis not present

## 2021-05-15 DIAGNOSIS — H6991 Unspecified Eustachian tube disorder, right ear: Secondary | ICD-10-CM | POA: Diagnosis not present

## 2021-05-15 DIAGNOSIS — M25561 Pain in right knee: Secondary | ICD-10-CM | POA: Diagnosis not present

## 2021-06-20 DIAGNOSIS — M25551 Pain in right hip: Secondary | ICD-10-CM | POA: Diagnosis not present

## 2021-06-20 DIAGNOSIS — M7061 Trochanteric bursitis, right hip: Secondary | ICD-10-CM | POA: Diagnosis not present

## 2021-06-27 DIAGNOSIS — H903 Sensorineural hearing loss, bilateral: Secondary | ICD-10-CM | POA: Diagnosis not present

## 2021-06-27 DIAGNOSIS — H9209 Otalgia, unspecified ear: Secondary | ICD-10-CM | POA: Diagnosis not present

## 2021-07-11 DIAGNOSIS — M25551 Pain in right hip: Secondary | ICD-10-CM | POA: Diagnosis not present

## 2021-07-11 DIAGNOSIS — M25651 Stiffness of right hip, not elsewhere classified: Secondary | ICD-10-CM | POA: Diagnosis not present

## 2021-07-11 DIAGNOSIS — R2681 Unsteadiness on feet: Secondary | ICD-10-CM | POA: Diagnosis not present

## 2021-07-11 DIAGNOSIS — R2689 Other abnormalities of gait and mobility: Secondary | ICD-10-CM | POA: Diagnosis not present

## 2021-07-11 DIAGNOSIS — M545 Low back pain, unspecified: Secondary | ICD-10-CM | POA: Diagnosis not present

## 2021-07-14 ENCOUNTER — Other Ambulatory Visit: Payer: Self-pay

## 2021-07-14 DIAGNOSIS — Z91040 Latex allergy status: Secondary | ICD-10-CM | POA: Diagnosis not present

## 2021-07-14 DIAGNOSIS — N281 Cyst of kidney, acquired: Secondary | ICD-10-CM | POA: Insufficient documentation

## 2021-07-14 DIAGNOSIS — D72829 Elevated white blood cell count, unspecified: Secondary | ICD-10-CM | POA: Diagnosis not present

## 2021-07-14 DIAGNOSIS — N3001 Acute cystitis with hematuria: Secondary | ICD-10-CM | POA: Insufficient documentation

## 2021-07-14 DIAGNOSIS — R300 Dysuria: Secondary | ICD-10-CM | POA: Diagnosis present

## 2021-07-14 NOTE — ED Triage Notes (Signed)
Reports bilateral flank pain, dysuria and subjective fevers. Pt says she is prone to UTI's. Took tylenol 1000mg  approx 1hr pta.

## 2021-07-15 ENCOUNTER — Encounter (HOSPITAL_BASED_OUTPATIENT_CLINIC_OR_DEPARTMENT_OTHER): Payer: Self-pay

## 2021-07-15 ENCOUNTER — Emergency Department (HOSPITAL_BASED_OUTPATIENT_CLINIC_OR_DEPARTMENT_OTHER): Payer: Medicaid Other

## 2021-07-15 ENCOUNTER — Emergency Department (HOSPITAL_BASED_OUTPATIENT_CLINIC_OR_DEPARTMENT_OTHER)
Admission: EM | Admit: 2021-07-15 | Discharge: 2021-07-15 | Disposition: A | Discharge status: Home / Self Care | Payer: Medicaid Other | Attending: Emergency Medicine | Admitting: Emergency Medicine

## 2021-07-15 DIAGNOSIS — N3001 Acute cystitis with hematuria: Secondary | ICD-10-CM

## 2021-07-15 DIAGNOSIS — N281 Cyst of kidney, acquired: Secondary | ICD-10-CM

## 2021-07-15 DIAGNOSIS — N2889 Other specified disorders of kidney and ureter: Secondary | ICD-10-CM | POA: Diagnosis not present

## 2021-07-15 HISTORY — DX: Essential (primary) hypertension: I10

## 2021-07-15 LAB — BASIC METABOLIC PANEL
Anion gap: 8 (ref 5–15)
BUN: 16 mg/dL (ref 6–20)
CO2: 29 mmol/L (ref 22–32)
Calcium: 9.3 mg/dL (ref 8.9–10.3)
Chloride: 104 mmol/L (ref 98–111)
Creatinine, Ser: 0.92 mg/dL (ref 0.44–1.00)
GFR, Estimated: >60 mL/min (ref >60)
Glucose, Bld: 114 mg/dL — ABNORMAL HIGH (ref 70–99)
Potassium: 3.6 mmol/L (ref 3.5–5.1)
Sodium: 141 mmol/L (ref 135–145)

## 2021-07-15 LAB — CBC WITH DIFFERENTIAL/PLATELET
Abs Immature Granulocytes: 0.04 10*3/uL (ref 0.00–0.07)
Basophils Absolute: 0.1 10*3/uL (ref 0.0–0.1)
Basophils Relative: 1 %
Eosinophils Absolute: 0.4 10*3/uL (ref 0.0–0.5)
Eosinophils Relative: 3 %
HCT: 38.3 % (ref 36.0–46.0)
Hemoglobin: 12.5 g/dL (ref 12.0–15.0)
Immature Granulocytes: 0 %
Lymphocytes Relative: 30 %
Lymphs Abs: 3.5 10*3/uL (ref 0.7–4.0)
MCH: 27.7 pg (ref 26.0–34.0)
MCHC: 32.6 g/dL (ref 30.0–36.0)
MCV: 84.9 fL (ref 80.0–100.0)
Monocytes Absolute: 0.7 10*3/uL (ref 0.1–1.0)
Monocytes Relative: 6 %
Neutro Abs: 6.9 10*3/uL (ref 1.7–7.7)
Neutrophils Relative %: 60 %
Platelets: 239 10*3/uL (ref 150–400)
RBC: 4.51 MIL/uL (ref 3.87–5.11)
RDW: 13.8 % (ref 11.5–15.5)
WBC: 11.6 10*3/uL — ABNORMAL HIGH (ref 4.0–10.5)
nRBC: 0 % (ref 0.0–0.2)

## 2021-07-15 LAB — URINALYSIS, ROUTINE W REFLEX MICROSCOPIC
Bilirubin Urine: NEGATIVE
Glucose, UA: NEGATIVE mg/dL
Ketones, ur: NEGATIVE mg/dL
Nitrite: NEGATIVE
Protein, ur: 100 mg/dL — AB
RBC / HPF: >50 RBC/hpf — ABNORMAL HIGH (ref 0–5)
Specific Gravity, Urine: 1.013 (ref 1.005–1.030)
Trans Epithel, UA: 1
WBC, UA: >50 WBC/hpf — ABNORMAL HIGH (ref 0–5)
pH: 6 (ref 5.0–8.0)

## 2021-07-15 LAB — PREGNANCY, URINE: Preg Test, Ur: NEGATIVE

## 2021-07-15 MED ORDER — CEPHALEXIN 250 MG PO CAPS
500.0000 mg | ORAL_CAPSULE | Freq: Once | ORAL | Status: AC
  Administered 2021-07-15: 500 mg via ORAL
  Filled 2021-07-15: qty 2

## 2021-07-15 MED ORDER — CEPHALEXIN 500 MG PO CAPS: 500.0000 mg | ORAL_CAPSULE | Freq: Two times a day (BID) | ORAL | 0 refills | Status: AC

## 2021-07-15 MED ORDER — CEPHALEXIN 500 MG PO CAPS: 500.0000 mg | ORAL_CAPSULE | Freq: Two times a day (BID) | ORAL | 0 refills | Status: DC

## 2021-07-15 NOTE — ED Provider Notes (Signed)
[facility] EMERGENCY DEPT  Provider Note  CSN: [id number] Arrival date & time: 07/14/21 2334  History Chief Complaint  Patient presents with   Flank Pain    Betty Dean is a 31 y.o. female with history of recurrent UTI she typically manages at home with increased fluid intake reports 2 days of dysuria and frequency and also L flank pain which is unusual for her. She reports the flank pain was sharp and severe earlier but has improved since arrival. She has had subjective fevers but no vomiting at home. No recent Abx for UTI.    Home Medications Prior to Admission medications   Medication Sig Start Date End Date Taking? Authorizing Provider  acetaminophen (TYLENOL) 325 MG tablet Take 650 mg by mouth every 6 (six) hours as needed for mild pain, moderate pain or headache.     [provider]  cephALEXin (KEFLEX) 500 MG capsule Take 1 capsule (500 mg total) by mouth 2 (two) times daily for 7 days. 07/15/21 07/22/21  Pollyann Savoy, MD  rizatriptan (MAXALT-MLT) 10 MG disintegrating tablet Take 1 tablet (10 mg total) by mouth as needed for migraine. May repeat in 2 hours if needed Patient not taking: Reported on 09/14/2015 05/17/14   Penumalli, Glenford Bayley, MD  topiramate (TOPAMAX) 50 MG tablet Take 1 tablet (50 mg total) by mouth 2 (two) times daily. Patient not taking: Reported on 09/14/2015 05/17/14   Suanne Marker, MD     Allergies    Latex and Shellfish allergy   Review of Systems   Review of Systems Please see HPI for pertinent positives and negatives  Physical Exam BP (!) 142/99   Pulse 78   Temp 98.4 F (36.9 C)   Resp 16   Ht 5\' 5"  (1.651 m)   Wt 93.9 kg   LMP 04/16/2021 (Approximate) Comment: pcos  SpO2 98%   BMI 34.45 kg/m   Physical Exam Vitals and nursing note reviewed.  Constitutional:      Appearance: Normal appearance.  HENT:     Head: Normocephalic and atraumatic.     Nose: Nose normal.     Mouth/Throat:     Mouth: Mucous  membranes are moist.  Eyes:     Extraocular Movements: Extraocular movements intact.     Conjunctiva/sclera: Conjunctivae normal.  Cardiovascular:     Rate and Rhythm: Normal rate.  Pulmonary:     Effort: Pulmonary effort is normal.     Breath sounds: Normal breath sounds.  Abdominal:     General: Abdomen is flat.     Palpations: Abdomen is soft.     Tenderness: There is no abdominal tenderness.  Musculoskeletal:        General: No swelling. Normal range of motion.     Cervical back: Neck supple.  Skin:    General: Skin is warm and dry.  Neurological:     General: No focal deficit present.     Mental Status: She is alert.  Psychiatric:        Mood and Affect: Mood normal.    ED Results / Procedures / Treatments   EKG None  Procedures Procedures  Medications Ordered in the ED Medications  cephALEXin (KEFLEX) capsule 500 mg (500 mg Oral Given 07/15/21 0401)    Initial Impression and Plan  Patient here with dysuria and L flank pain, UA done in triage shows signs of infection and blood, given her flank pain concern for renal stone. Will check basic labs and send  for CT. Currently pain free.   ED Course   Clinical Course as of 07/15/21 0407  Sat Jul 15, 2021  0319 CBC with mild leukocytosis.  [CS]  [id number] I personally viewed the images from radiology studies and agree with radiologist interpretation: CT neg for stone. Incidental renal cyst unlikely to be related but she will need outpatient follow up for further eval.   [CS]  0404 BMP is unremarkable. Discussed these results with patient including incidental CT findings. Plan discharge with Rx for Keflex and PCP follow up.  [CS]    Clinical Course User Index [CS] Pollyann Savoy, MD     MDM Rules/Calculators/A&P Medical Decision Making Problems Addressed: Acute cystitis with hematuria: acute illness or injury  Amount and/or Complexity of Data Reviewed Labs: ordered. Decision-making details documented in ED  Course. Radiology: ordered and independent interpretation performed. Decision-making details documented in ED Course.  Risk Prescription drug management.    Final Clinical Impression(s) / ED Diagnoses Final diagnoses:  Acute cystitis with hematuria  Renal cyst    Rx / DC Orders ED Discharge Orders          Ordered    cephALEXin (KEFLEX) 500 MG capsule  2 times daily,   Status:  Discontinued        07/15/21 0406    cephALEXin (KEFLEX) 500 MG capsule  2 times daily        07/15/21 0407             Pollyann Savoy, MD 07/15/21 [phone]

## 2021-07-15 NOTE — ED Notes (Signed)
Patient transported to CT 

## 2021-08-17 DIAGNOSIS — R6889 Other general symptoms and signs: Secondary | ICD-10-CM | POA: Diagnosis not present

## 2021-08-17 DIAGNOSIS — M25651 Stiffness of right hip, not elsewhere classified: Secondary | ICD-10-CM | POA: Diagnosis not present

## 2021-08-17 DIAGNOSIS — M25551 Pain in right hip: Secondary | ICD-10-CM | POA: Diagnosis not present

## 2021-08-17 DIAGNOSIS — M6289 Other specified disorders of muscle: Secondary | ICD-10-CM | POA: Diagnosis not present

## 2021-08-17 DIAGNOSIS — M6281 Muscle weakness (generalized): Secondary | ICD-10-CM | POA: Diagnosis not present

## 2021-09-08 DIAGNOSIS — F411 Generalized anxiety disorder: Secondary | ICD-10-CM | POA: Diagnosis not present

## 2021-09-08 DIAGNOSIS — R002 Palpitations: Secondary | ICD-10-CM | POA: Diagnosis not present

## 2021-09-08 DIAGNOSIS — E6609 Other obesity due to excess calories: Secondary | ICD-10-CM | POA: Diagnosis not present

## 2021-09-08 DIAGNOSIS — L659 Nonscarring hair loss, unspecified: Secondary | ICD-10-CM | POA: Diagnosis not present

## 2021-09-08 DIAGNOSIS — R55 Syncope and collapse: Secondary | ICD-10-CM | POA: Diagnosis not present

## 2021-09-08 DIAGNOSIS — R000 Tachycardia, unspecified: Secondary | ICD-10-CM | POA: Diagnosis not present

## 2021-09-14 DIAGNOSIS — M6289 Other specified disorders of muscle: Secondary | ICD-10-CM | POA: Diagnosis not present

## 2021-09-14 DIAGNOSIS — M6281 Muscle weakness (generalized): Secondary | ICD-10-CM | POA: Diagnosis not present

## 2021-09-14 DIAGNOSIS — M25651 Stiffness of right hip, not elsewhere classified: Secondary | ICD-10-CM | POA: Diagnosis not present

## 2021-09-14 DIAGNOSIS — M25551 Pain in right hip: Secondary | ICD-10-CM | POA: Diagnosis not present

## 2021-09-14 DIAGNOSIS — R6889 Other general symptoms and signs: Secondary | ICD-10-CM | POA: Diagnosis not present

## 2021-09-18 DIAGNOSIS — F339 Major depressive disorder, recurrent, unspecified: Secondary | ICD-10-CM | POA: Diagnosis not present

## 2021-10-03 DIAGNOSIS — R002 Palpitations: Secondary | ICD-10-CM | POA: Diagnosis not present

## 2021-10-03 DIAGNOSIS — I10 Essential (primary) hypertension: Secondary | ICD-10-CM | POA: Diagnosis not present

## 2021-10-03 DIAGNOSIS — R55 Syncope and collapse: Secondary | ICD-10-CM | POA: Diagnosis not present

## 2021-10-19 DIAGNOSIS — U071 COVID-19: Secondary | ICD-10-CM | POA: Diagnosis not present

## 2021-11-07 DIAGNOSIS — R002 Palpitations: Secondary | ICD-10-CM | POA: Diagnosis not present

## 2021-11-07 DIAGNOSIS — R55 Syncope and collapse: Secondary | ICD-10-CM | POA: Diagnosis not present

## 2021-11-07 DIAGNOSIS — I10 Essential (primary) hypertension: Secondary | ICD-10-CM | POA: Diagnosis not present

## 2021-11-12 DIAGNOSIS — Z79899 Other long term (current) drug therapy: Secondary | ICD-10-CM | POA: Diagnosis not present

## 2021-11-12 DIAGNOSIS — Z7982 Long term (current) use of aspirin: Secondary | ICD-10-CM | POA: Diagnosis not present

## 2021-11-12 DIAGNOSIS — Z8673 Personal history of transient ischemic attack (TIA), and cerebral infarction without residual deficits: Secondary | ICD-10-CM | POA: Diagnosis not present

## 2021-11-12 DIAGNOSIS — R29818 Other symptoms and signs involving the nervous system: Secondary | ICD-10-CM | POA: Diagnosis not present

## 2021-11-12 DIAGNOSIS — I10 Essential (primary) hypertension: Secondary | ICD-10-CM | POA: Diagnosis not present

## 2021-11-12 DIAGNOSIS — Z720 Tobacco use: Secondary | ICD-10-CM | POA: Diagnosis not present

## 2021-11-12 DIAGNOSIS — I639 Cerebral infarction, unspecified: Secondary | ICD-10-CM | POA: Diagnosis not present

## 2021-11-12 DIAGNOSIS — F17210 Nicotine dependence, cigarettes, uncomplicated: Secondary | ICD-10-CM | POA: Diagnosis not present

## 2021-11-12 DIAGNOSIS — R9402 Abnormal brain scan: Secondary | ICD-10-CM | POA: Diagnosis not present

## 2021-11-12 DIAGNOSIS — Z9049 Acquired absence of other specified parts of digestive tract: Secondary | ICD-10-CM | POA: Diagnosis not present

## 2021-11-12 DIAGNOSIS — I6381 Other cerebral infarction due to occlusion or stenosis of small artery: Secondary | ICD-10-CM | POA: Diagnosis not present

## 2021-11-12 DIAGNOSIS — Z7902 Long term (current) use of antithrombotics/antiplatelets: Secondary | ICD-10-CM | POA: Diagnosis not present

## 2021-11-12 DIAGNOSIS — R4701 Aphasia: Secondary | ICD-10-CM | POA: Diagnosis not present

## 2021-11-12 DIAGNOSIS — Z7901 Long term (current) use of anticoagulants: Secondary | ICD-10-CM | POA: Diagnosis not present

## 2021-11-13 DIAGNOSIS — I6381 Other cerebral infarction due to occlusion or stenosis of small artery: Secondary | ICD-10-CM | POA: Diagnosis not present

## 2021-11-13 DIAGNOSIS — I10 Essential (primary) hypertension: Secondary | ICD-10-CM | POA: Diagnosis not present

## 2021-11-13 DIAGNOSIS — I639 Cerebral infarction, unspecified: Secondary | ICD-10-CM | POA: Diagnosis not present

## 2021-11-13 DIAGNOSIS — I5189 Other ill-defined heart diseases: Secondary | ICD-10-CM | POA: Diagnosis not present

## 2021-11-13 DIAGNOSIS — R4701 Aphasia: Secondary | ICD-10-CM | POA: Diagnosis not present

## 2021-11-15 ENCOUNTER — Encounter: Payer: Self-pay | Admitting: Neurology

## 2021-11-15 DIAGNOSIS — I639 Cerebral infarction, unspecified: Secondary | ICD-10-CM | POA: Diagnosis not present

## 2021-11-15 DIAGNOSIS — Z6834 Body mass index (BMI) 34.0-34.9, adult: Secondary | ICD-10-CM | POA: Diagnosis not present

## 2021-11-15 DIAGNOSIS — E669 Obesity, unspecified: Secondary | ICD-10-CM | POA: Diagnosis not present

## 2021-11-21 NOTE — Progress Notes (Deleted)
NEUROLOGY CONSULTATION NOTE  Betty Dean MRN: [id number] DOB: 1990-12-23  Referring provider: Lenise Herald, PA-C (hospital referral) Primary care provider: Mady Gemma, PA-C  Reason for consult:  stroke  Assessment/Plan:   ***   Subjective:  Betty Dean is a 31 year old female with HTN, tobacco abuse, depression, anxiety, migraine, PCOS and history of stomach ulcer who presents for stroke.  History supplemented by cardiology and hospital records.  On 11/11/2021, she developed word-finding difficulty, could not write her name and had difficulty with simple tasks such as typing on her phone.  No associated headache, dizziness, visual disturbance, unilateral numbness or weakness.  She has history of classic migraines which are manageable.  Denies recent head trauma.  For the previous month, she had been experiencing dizziness and palpitations.  She was evaluated by cardiology and underwent a 4 week cardiac event monitor which revealed 4 PVCs with 1 morphologies but no a fib, AV block or pauses or other significant arrhythmias.  [facility] went to [facility] where she was admitted.  Blood pressure in the ED was 205/112.  CT perfusion was negative but CT head demonstrated decreased density in the left basal ganglia.  CTA of head and neck revealed  no LVO, hemodynamically significant stenosis or dissection.  MRI of brain confirmed a moderate lacunar infarct within the left basal ganglia.  Echo with bubble study showed EF 55-60% with no PFO.  She was discharged on dual antiplatelet therapy (ASA 81mg  and Plavix) followed by *** alone.  She was also started on Crestor.     CT PERFUSION W:  No evidence of ischemia in the sampled regions of the brain CT HEAD WO:  1. No acute cortical stroke. 2. Small region of decreased density in the left basal ganglia and extending into the left periventricular white matter. This may represent lacune or can be seen with demyelinating process. MRI BRAIN WO:   Moderate acute lacunar infarct left basal ganglia to corona radiata. Otherwise, relatively normal appearance of the brain. CTA HEAD & NECK:  No evidence of dissection, occlusion, aneurysm, or hemodynamically significant stenosis of the arteries in the head/neck. ECHO WITH BUBBLE STUDY:  1. Left ventricle: The cavity size is normal. Systolic function is normal. The estimated ejection fraction is 55-60%. Wall motion is normal; there are no regional wall motion abnormalities. Grade I diastolic dysfunction. 2. Right ventricle: The cavity size is normal. Systolic function is normal. 3. No significant valve disease is identified. 4. Atrial septum: No defect or patent foramen ovale is identified. Negative bubble study.  30 DAY CARDIAC EVENT MONITOR:  The patient was monitored for a total of 27d 20h, underlying rhythm is Sinus. The minimum heart rate was 46 bpm; the maximum 143 bpm; the average 76 bpm. No A-fib, AV block, pauses, VT or VF identified. There were a total of 4 PVCs with 1 morphologies and 0 couplets. Overall PVC Burden at < 0.01 % There were a total of 0 Other Beats. There were 0 total number of paced beats. There were a total of 2070 PSVCs with 1 morphologies and 0 couplets. Overall PSVC Burden at 0.07 % There is a total of 6 patient events: Dizziness/near syncope associated with normal sinus rhythm, rapid/fast heartbeat associated with mild sinus tachycardia, palpitations/skipping associated with mild sinus tachycardia. Benign-appearing cardiac event monitor. LABS:  Lipid panel with chol 191, TG 653, HDL 28 and direct-LDL 118;   PAST MEDICAL HISTORY: Past Medical History:  Diagnosis Date   Anxious mood  Depression    GERD (gastroesophageal reflux disease)    Hypertension    Migraine    "maybe once/wk" (10/15/2014)   Polycystic ovarian syndrome    Stomach ulcer     PAST SURGICAL HISTORY: Past Surgical History:  Procedure Laterality Date   CESAREAN SECTION     CHOLECYSTECTOMY     NO  PAST SURGERIES      MEDICATIONS: Current Outpatient Medications on File Prior to Visit  Medication Sig Dispense Refill   acetaminophen (TYLENOL) 325 MG tablet Take 650 mg by mouth every 6 (six) hours as needed for mild pain, moderate pain or headache.      rizatriptan (MAXALT-MLT) 10 MG disintegrating tablet Take 1 tablet (10 mg total) by mouth as needed for migraine. May repeat in 2 hours if needed (Patient not taking: Reported on 09/14/2015) 9 tablet 11   topiramate (TOPAMAX) 50 MG tablet Take 1 tablet (50 mg total) by mouth 2 (two) times daily. (Patient not taking: Reported on 09/14/2015) 60 tablet 12   No current facility-administered medications on file prior to visit.    ALLERGIES: Allergies  Allergen Reactions   Latex Itching, Rash and Hives   Shellfish Allergy Itching and Swelling    Rash / Swelling Rash / Swelling     FAMILY HISTORY: Family History  Problem Relation Age of Onset   Migraines Mother    Migraines Maternal Aunt    Multiple sclerosis Maternal Grandmother    Migraines Maternal Grandmother     Objective:  *** General: No acute distress.  Patient appears well-groomed.   Head:  Normocephalic/atraumatic Eyes:  fundi examined but not visualized Neck: supple, no paraspinal tenderness, full range of motion Back: No paraspinal tenderness Heart: regular rate and rhythm Lungs: Clear to auscultation bilaterally. Vascular: No carotid bruits. Neurological Exam: Mental status: alert and oriented to person, place, and time, speech fluent and not dysarthric, language intact. Cranial nerves: CN I: not tested CN II: pupils equal, round and reactive to light, visual fields intact CN III, IV, VI:  full range of motion, no nystagmus, no ptosis CN V: facial sensation intact. CN VII: upper and lower face symmetric CN VIII: hearing intact CN IX, X: gag intact, uvula midline CN XI: sternocleidomastoid and trapezius muscles intact CN XII: tongue midline Bulk & Tone:  normal, no fasciculations. Motor:  muscle strength 5/5 throughout Sensation:  Pinprick, temperature and vibratory sensation intact. Deep Tendon Reflexes:  2+ throughout,  toes downgoing.   Finger to nose testing:  Without dysmetria.   Heel to shin:  Without dysmetria.   Gait:  Normal station and stride.  Romberg negative.    Thank you for allowing me to take part in the care of this patient.  Metta Clines, DO  CC: ***

## 2021-11-22 ENCOUNTER — Ambulatory Visit: Payer: Medicaid Other | Admitting: Neurology

## 2021-11-28 ENCOUNTER — Encounter: Payer: Medicaid Other | Admitting: Speech Pathology

## 2021-12-11 NOTE — Therapy (Signed)
OUTPATIENT SPEECH LANGUAGE PATHOLOGY APHASIA EVALUATION   Patient Name: Betty Dean MRN: [id number] DOB:August 06, 1990, 31 y.o., female Today's Date: 12/12/2021  PCP: Richmond Campbell., PA-C REFERRING PROVIDER: Shawnie Dapper., PA-C   End of Session - 12/12/21 1405     Visit Number 1    Authorization Type [place] [employer]    SLP Start Time 0845    SLP Stop Time  0930    SLP Time Calculation (min) 45 min             Past Medical History:  Diagnosis Date   Anxious mood    Depression    GERD (gastroesophageal reflux disease)    Hypertension    Migraine    "maybe once/wk" (10/15/2014)   Polycystic ovarian syndrome    Stomach ulcer    Past Surgical History:  Procedure Laterality Date   BLADDER REPAIR     CESAREAN SECTION     CHOLECYSTECTOMY     NO PAST SURGERIES     TUBAL LIGATION     Patient Active Problem List   Diagnosis Date Noted   Acute pyelonephritis 10/15/2014   Anemia, iron deficiency 10/15/2014   Acute renal failure (HCC) 10/15/2014   Sepsis (HCC)    Leukocytosis    Hypokalemia     ONSET DATE: 11/12/21   REFERRING DIAG: R47.9 (ICD-10-CM) - Speech impairment  THERAPY DIAG:  Cognitive communication deficit  Rationale for Evaluation and Treatment Rehabilitation  SUBJECTIVE:   SUBJECTIVE STATEMENT: "I have a really hard time focusing"  Pt accompanied by: self  PERTINENT HISTORY: 31 years old female past medical history of HTN, ongoing tobacco abuse, recently evaluated by cardiology for dizziness and palpitations s/p cardiac event monitor for 1 month without any arrhythmia who [facility] complaining of difficulty with speech. MRI showed moderate acute lacunar infarct left basal ganglia to corona radiata.  PAIN:  Are you having pain? No  FALLS: Has patient fallen in last 6 months?  Comment: "probably but I'm a clutz"   LIVING ENVIRONMENT: Lives with: lives with their family Lives in: House/apartment  PLOF:  Level of  assistance: Independent with IADLs Employment: Other: unemployed   PATIENT GOALS: "get a job"   OBJECTIVE:   DIAGNOSTIC FINDINGS: MRI Head Without IV Contrast Result Date: 11/13/2021 IMPRESSION: Moderate acute lacunar infarct left basal ganglia to corona radiata. Otherwise, relatively normal appearance of the brain.  COGNITION: Overall cognitive status: Impaired Areas of impairment:  Attention: Impaired: Sustained, Selective, Alternating, Divided Memory: Impaired: Prospective Auditory Executive function: Impaired: Initiation, Organization, Planning, and Slow processing Functional deficits: decreased motivation, task initiation/completion, forgetting appointments, medication/financial/schedule management.   AUDITORY COMPREHENSION: Overall auditory comprehension: Appears intact Conversation: Moderately Complex Interfering components: attention and processing speed Effective technique: extra processing time and pausing  EXPRESSION: verbal  VERBAL EXPRESSION: Level of generative/spontaneous verbalization: conversation Naming: Responsive: 76-100%, Confrontation: 76-100%, and Convergent: 76-100% Pragmatics: Appears intact Interfering components: attention Effective technique:  none needed Non-verbal means of communication: gestures, writing, and texting Comments: functional in conversation today. Responds to questions appropriately, cohesive and coherent discourse.   MOTOR SPEECH: Overall motor speech: impaired Level of impairment: Conversation Respiration: thoracic breathing Phonation: normal Resonance: WFL Articulation: Appears intact Intelligibility: Intelligible Motor planning: Appears intact Comments: speech within gross normal limits, however, pt reports change from baseline. Reports that rate has reduced significantly. Overall intelligible without overt deviations in motor speech production  ORAL MOTOR EXAMINATION: Overall status: WFL  STANDARDIZED  ASSESSMENTS: Deferred in lieu of motivational interviewing to  determine pt needs   TODAY'S TREATMENT:                                                                                                         12-12-21: SLP provided education and training on attention, processing, memory strategies and compensations. SLP demonstrated all briefly. Provided handout. Provided pt with strategies to optimize verbal expression: maintaining composure and using intentional physical distraction technique to aid in word finding, as pt reports improved word finding when "not thinking about it." Education re: ST role in rehabilitation. Pt verbalizes understanding but states limited resources prohibiting ability to attend therapy sessions at this time. SLP advised pt will need new referral should circumstances change.    PATIENT EDUCATION: Education details: see above Person educated: Patient Education method: Explanation, Demonstration, and Handouts Education comprehension: verbalized understanding and returned demonstration   ASSESSMENT:  CLINICAL IMPRESSION: Patient is a 31 y.o. F who was seen today for cognitive linguistic evaluation s/p basal ganglia infarct. Pt presenting with mild impairment in executive functioning, attention, memory. Mild impairment in verbal expression, most notable with increased stress, frustration, and fatigue. Verbal expression likely impacted by aforementioned cognitive impairments. Mild dysarthria c/b reduced rate, mono pitch and mono loudness. Overall, maintains 100% intelligibility. Impairments impacting motivation, initiation, task completion, medication/schedule/financial management. While skilled ST is recommended at this time, pt has financial barriers and reduced family/community support which have resulted in pt declining speech therapy interventions at this time. SLP provided education on general strategies and compensations pt can implement to aid in return to baseline  functioning.   OBJECTIVE IMPAIRMENTS: include attention, memory, executive functioning, expressive language, and dysarthria. These impairments are limiting patient from managing medications, managing appointments, managing finances, household responsibilities, ADLs/IADLs, and effectively communicating at home and in community. Factors affecting potential to achieve goals and functional outcome are financial resources and family/community support. Patient will benefit from skilled SLP services to address above impairments and improve overall function, however is unable to participate in therapy at this time d/t factors mentioned above. Recommend pt seek new referral should circumstances change and she is able to attend therapy consistently with reduced stress.   PLAN:  SLP FREQUENCY: one time visit   Su Monks, Elmo 12/12/2021, 2:07 PM

## 2021-12-11 NOTE — Progress Notes (Signed)
NEUROLOGY CONSULTATION NOTE  Betty Dean MRN: [id number] DOB: January 10, 1991  Referring provider: Lenise Herald, PA-C (hospital referral) Primary care provider: Mady Gemma, PA-C  Reason for consult:  stroke  Assessment/Plan:   Left basal ganglia infarct - likely small vessel given comorbidities, but due to age, would still check hypercoagulable panel. Migraine with and without contrast, without status migrainosus, not intractable. Hypertension Tobacco use disorder Hyperlipidemia  Will check hypercoagulable panel Secondary stroke prevention as managed by PCP: ASA 81mg  daily Statin.  LDL goal less than 70 Normotensive blood pressure Hgb A1c goal less than 7 Migraine prevention:  topiramate 50mg  BID Migraine rescue:  she will try Ubrelvy 100mg .  Triptans are now contraindicated. Smoking cessation Mediterranean diet Routine excercise Follow up 4-5 months.    Subjective:  Betty Dean is a 31 year old right-handed female with HTN, tobacco abuse, depression, anxiety, migraine, PCOS and history of stomach ulcer who presents for stroke.  History supplemented by cardiology and hospital records.  On 11/11/2021, she developed word-finding difficulty, could not write her name and had difficulty with simple tasks such as typing on her phone.  Also noticed some right arm and hand weakness.  Some difficulty with coordination of her right foot and hand.  No associated headache, dizziness, visual disturbance, unilateral numbness or weakness.  She has history of classic migraines which are manageable.  Denies recent head trauma.  For the previous month, she had been experiencing dizziness and palpitations.  She was evaluated by cardiology and underwent a 4 week cardiac event monitor which revealed 4 PVCs with 1 morphologies but no a fib, AV block or pauses or other significant arrhythmias.  She went to [facility] where she was admitted.  Blood pressure in the ED was 205/112.  CT  perfusion was negative but CT head demonstrated decreased density in the left basal ganglia.  CTA of head and neck revealed  no LVO, hemodynamically significant stenosis or dissection.  MRI of brain confirmed a moderate lacunar infarct within the left basal ganglia.  Echo with bubble study showed EF 55-60% with no PFO.  She was discharged on dual antiplatelet therapy (ASA 81mg  and Plavix) followed by ASA alone.  She was also started on Crestor.    Overall, symptoms are much improved.  Still feel slightly weak in right hand and sometimes may have trouble with words.  Has cut down on smoking from 1 1/2 ppd to 5-6 cigarettes daily.  She also has migraines.  Migraines are severe bilateral retro-orbital pounding pain with nausea, vomiting, photophobia, phonophobia and sometimes with blurred vision or sees stars.  Usually lasts 1-2 days and occurs once a month.  Currently takes topiramate 50mg  BID.  Uses Tylenol or ibuprofen which are not effective.  Previously took rizatriptan and Fioricet.    CT PERFUSION W:  No evidence of ischemia in the sampled regions of the brain CT HEAD WO:  1. No acute cortical stroke. 2. Small region of decreased density in the left basal ganglia and extending into the left periventricular white matter. This may represent lacune or can be seen with demyelinating process. MRI BRAIN WO:  Moderate acute lacunar infarct left basal ganglia to corona radiata. Otherwise, relatively normal appearance of the brain. CTA HEAD & NECK:  No evidence of dissection, occlusion, aneurysm, or hemodynamically significant stenosis of the arteries in the head/neck. ECHO WITH BUBBLE STUDY:  1. Left ventricle: The cavity size is normal. Systolic function is normal. The estimated ejection fraction is 55-60%. Wall  motion is normal; there are no regional wall motion abnormalities. Grade I diastolic dysfunction. 2. Right ventricle: The cavity size is normal. Systolic function is normal. 3. No significant valve  disease is identified. 4. Atrial septum: No defect or patent foramen ovale is identified. Negative bubble study.  [place]:  The patient was monitored for a total of 27d 20h, underlying rhythm is Sinus. The minimum heart rate was 46 bpm; the maximum 143 bpm; the average 76 bpm. No A-fib, AV block, pauses, VT or VF identified. There were a total of 4 PVCs with 1 morphologies and 0 couplets. Overall PVC Burden at < 0.01 % There were a total of 0 Other Beats. There were 0 total number of paced beats. There were a total of 2070 PSVCs with 1 morphologies and 0 couplets. Overall PSVC Burden at 0.07 % There is a total of 6 patient events: Dizziness/near syncope associated with normal sinus rhythm, rapid/fast heartbeat associated with mild sinus tachycardia, palpitations/skipping associated with mild sinus tachycardia. Benign-appearing cardiac event monitor. LABS:  Lipid panel with chol 191, TG 653, HDL 28 and direct-LDL 118;    PAST MEDICAL HISTORY: Past Medical History:  Diagnosis Date   Anxious mood    Depression    GERD (gastroesophageal reflux disease)    Hypertension    Migraine    "maybe once/wk" (10/15/2014)   Polycystic ovarian syndrome    Stomach ulcer     PAST SURGICAL HISTORY: Past Surgical History:  Procedure Laterality Date   CESAREAN SECTION     CHOLECYSTECTOMY     NO PAST SURGERIES      MEDICATIONS: Current Outpatient Medications on File Prior to Visit  Medication Sig Dispense Refill   acetaminophen (TYLENOL) 325 MG tablet Take 650 mg by mouth every 6 (six) hours as needed for mild pain, moderate pain or headache.      rizatriptan (MAXALT-MLT) 10 MG disintegrating tablet Take 1 tablet (10 mg total) by mouth as needed for migraine. May repeat in 2 hours if needed (Patient not taking: Reported on 09/14/2015) 9 tablet 11   topiramate (TOPAMAX) 50 MG tablet Take 1 tablet (50 mg total) by mouth 2 (two) times daily. (Patient not taking: Reported on 09/14/2015) 60  tablet 12   No current facility-administered medications on file prior to visit.    ALLERGIES: Allergies  Allergen Reactions   Latex Itching, Rash and Hives   Shellfish Allergy Itching and Swelling    Rash / Swelling Rash / Swelling     FAMILY HISTORY: Family History  Problem Relation Age of Onset   Migraines Mother    Migraines Maternal Aunt    Multiple sclerosis Maternal Grandmother    Migraines Maternal Grandmother     Objective:  Blood pressure (!) 158/82, pulse 75, height 5\' 6"  (1.676 m), weight 173 lb 12.8 oz (78.8 kg), SpO2 (!) 82 %. General: No acute distress.  Patient appears well-groomed.   Head:  Normocephalic/atraumatic Eyes:  fundi examined but not visualized Neck: supple, no paraspinal tenderness, full range of motion Back: No paraspinal tenderness Heart: regular rate and rhythm Lungs: Clear to auscultation bilaterally. Vascular: No carotid bruits. Neurological Exam: Mental status: alert and oriented to person, place, and time, speech fluent and not dysarthric, language intact. Cranial nerves: CN I: not tested CN II: pupils equal, round and reactive to light, visual fields intact CN III, IV, VI:  full range of motion, no nystagmus, no ptosis CN V: facial sensation intact. CN VII: upper and  lower face symmetric CN VIII: hearing intact CN IX, X: gag intact, uvula midline CN XI: sternocleidomastoid and trapezius muscles intact CN XII: tongue midline Bulk & Tone: normal, no fasciculations. Motor:  muscle strength 5/5 throughout Sensation:  Temperature and vibratory sensation intact. Deep Tendon Reflexes:  2+ throughout,  toes downgoing.   Finger to nose testing:  Without dysmetria.   Heel to shin:  Without dysmetria.   Gait:  Normal station and stride.  Romberg negative.    Thank you for allowing me to take part in the care of this patient.  Shon Millet, DO  CC: Mady Gemma, PA-C

## 2021-12-12 ENCOUNTER — Ambulatory Visit (INDEPENDENT_AMBULATORY_CARE_PROVIDER_SITE_OTHER): Payer: Medicaid Other | Admitting: Neurology

## 2021-12-12 ENCOUNTER — Ambulatory Visit: Payer: Medicaid Other | Attending: Physician Assistant | Admitting: Speech Pathology

## 2021-12-12 ENCOUNTER — Other Ambulatory Visit (INDEPENDENT_AMBULATORY_CARE_PROVIDER_SITE_OTHER): Payer: Medicaid Other

## 2021-12-12 ENCOUNTER — Encounter: Payer: Self-pay | Admitting: Speech Pathology

## 2021-12-12 ENCOUNTER — Encounter: Payer: Self-pay | Admitting: Neurology

## 2021-12-12 VITALS — BP 158/82 | HR 75 | Ht 66.0 in | Wt 173.8 lb

## 2021-12-12 DIAGNOSIS — I10 Essential (primary) hypertension: Secondary | ICD-10-CM

## 2021-12-12 DIAGNOSIS — I6381 Other cerebral infarction due to occlusion or stenosis of small artery: Secondary | ICD-10-CM

## 2021-12-12 DIAGNOSIS — F17200 Nicotine dependence, unspecified, uncomplicated: Secondary | ICD-10-CM

## 2021-12-12 DIAGNOSIS — R41841 Cognitive communication deficit: Secondary | ICD-10-CM | POA: Insufficient documentation

## 2021-12-12 DIAGNOSIS — G43109 Migraine with aura, not intractable, without status migrainosus: Secondary | ICD-10-CM | POA: Diagnosis not present

## 2021-12-12 DIAGNOSIS — E785 Hyperlipidemia, unspecified: Secondary | ICD-10-CM

## 2021-12-12 DIAGNOSIS — G43009 Migraine without aura, not intractable, without status migrainosus: Secondary | ICD-10-CM | POA: Diagnosis not present

## 2021-12-12 NOTE — Patient Instructions (Signed)
Attention:  -start with either  box breathing, focusing on your breath--  Meditation app   -next you want to move to practicing mindfulness when doing things throughout the day  Mindfulness is being present, focusing on where you are, who your with, what sensory information your getting  redirect [place] back to breathe when wanders   Manage your input/output  Take breaks when you can Take help when you can  Do those household things when your son is awake   Medications: Make yourself an alarm  Create a routine -- for example move meds into bathroom and take just before brushing teeth  Leverage your cell phone: Use reminders app - make sure to set the alarm for when you'll be able to complete the task  Using your calendar for reoccurring events or appointments

## 2021-12-12 NOTE — Patient Instructions (Signed)
Continue aspirin 81mg  daily, rosuvastatin, blood pressure medications Take Ubrelvy 100mg  earliest onset of migraine.  May repeat after 2 hours (maximum 2 tablets in 24 hours).  Let me know if need prescription.  Do not take rizatriptan or anything ending in triptan Hypercoagulable panel Mediterranean diet Routine exercise Quit smoking Follow up 4-5 months  Mediterranean Diet A Mediterranean diet refers to food and lifestyle choices that are based on the traditions of countries located on the [employer]. It focuses on eating more fruits, vegetables, whole grains, beans, nuts, seeds, and heart-healthy fats, and eating less dairy, meat, eggs, and processed foods with added sugar, salt, and fat. This way of eating has been shown to help prevent certain conditions and improve outcomes for people who have chronic diseases, like kidney disease and heart disease. What are tips for following this plan? Reading food labels Check the serving size of packaged foods. For foods such as rice and pasta, the serving size refers to the amount of cooked product, not dry. Check the total fat in packaged foods. Avoid foods that have saturated fat or trans fats. Check the ingredient list for added sugars, such as corn syrup. Shopping  Buy a variety of foods that offer a balanced diet, including: Fresh fruits and vegetables (produce). Grains, beans, nuts, and seeds. Some of these may be available in unpackaged forms or large amounts (in bulk). Fresh seafood. Poultry and eggs. Low-fat dairy products. Buy whole ingredients instead of prepackaged foods. Buy fresh fruits and vegetables in-season from local farmers markets. Buy plain frozen fruits and vegetables. If you do not have access to quality fresh seafood, buy precooked frozen shrimp or canned fish, such as tuna, salmon, or sardines. Stock your pantry so you always have certain foods on hand, such as olive oil, canned tuna, canned tomatoes, rice,  pasta, and beans. Cooking Cook foods with extra-virgin olive oil instead of using butter or other vegetable oils. Have meat as a side dish, and have vegetables or grains as your main dish. This means having meat in small portions or adding small amounts of meat to foods like pasta or stew. Use beans or vegetables instead of meat in common dishes like chili or lasagna. Experiment with different cooking methods. Try roasting, broiling, steaming, and sauting vegetables. Add frozen vegetables to soups, stews, pasta, or rice. Add nuts or seeds for added healthy fats and plant protein at each meal. You can add these to yogurt, salads, or vegetable dishes. Marinate fish or vegetables using olive oil, lemon juice, garlic, and fresh herbs. Meal planning Plan to eat one vegetarian meal one day each week. Try to work up to two vegetarian meals, if possible. Eat seafood two or more times a week. Have healthy snacks readily available, such as: Vegetable sticks with hummus. Greek yogurt. Fruit and nut trail mix. Eat balanced meals throughout the week. This includes: Fruit: 2-3 servings a day. Vegetables: 4-5 servings a day. Low-fat dairy: 2 servings a day. Fish, poultry, or lean meat: 1 serving a day. Beans and legumes: 2 or more servings a week. Nuts and seeds: 1-2 servings a day. Whole grains: 6-8 servings a day. Extra-virgin olive oil: 3-4 servings a day. Limit red meat and sweets to only a few servings a month. Lifestyle  Cook and eat meals together with your family, when possible. Drink enough fluid to keep your urine pale yellow. Be physically active every day. This includes: Aerobic exercise like running or swimming. Leisure activities like gardening, walking, or housework.  Get 7-8 hours of sleep each night. If recommended by your health care provider, drink red wine in moderation. This means 1 glass a day for nonpregnant women and 2 glasses a day for men. A glass of wine equals 5 oz (150  mL). What foods should I eat? Fruits Apples. Apricots. Avocado. Berries. Bananas. Cherries. Dates. Figs. Grapes. Lemons. Melon. Oranges. Peaches. Plums. Pomegranate. Vegetables Artichokes. Beets. Broccoli. Cabbage. Carrots. Eggplant. Green beans. Chard. Kale. Spinach. Onions. Leeks. Peas. Squash. Tomatoes. Peppers. Radishes. Grains Whole-grain pasta. Brown rice. Bulgur wheat. Polenta. Couscous. Whole-wheat bread. Orpah Cobb. Meats and other proteins Beans. Almonds. Sunflower seeds. Pine nuts. Peanuts. Cod. Salmon. Scallops. Shrimp. Tuna. Tilapia. Clams. Oysters. Eggs. Poultry without skin. Dairy Low-fat milk. Cheese. Greek yogurt. Fats and oils Extra-virgin olive oil. Avocado oil. Grapeseed oil. Beverages Water. Red wine. Herbal tea. Sweets and desserts Greek yogurt with honey. Baked apples. Poached pears. Trail mix. Seasonings and condiments Basil. Cilantro. Coriander. Cumin. Mint. Parsley. Sage. Rosemary. Tarragon. Garlic. Oregano. Thyme. Pepper. Balsamic vinegar. Tahini. Hummus. Tomato sauce. Olives. Mushrooms. The items listed above may not be a complete list of foods and beverages you can eat. Contact a dietitian for more information. What foods should I limit? This is a list of foods that should be eaten rarely or only on special occasions. Fruits Fruit canned in syrup. Vegetables Deep-fried potatoes (french fries). Grains Prepackaged pasta or rice dishes. Prepackaged cereal with added sugar. Prepackaged snacks with added sugar. Meats and other proteins Beef. Pork. Lamb. Poultry with skin. Hot dogs. Tomasa Blase. Dairy Ice cream. Sour cream. Whole milk. Fats and oils Butter. Canola oil. Vegetable oil. Beef fat (tallow). Lard. Beverages Juice. Sugar-sweetened soft drinks. Beer. Liquor and spirits. Sweets and desserts Cookies. Cakes. Pies. Candy. Seasonings and condiments Mayonnaise. Pre-made sauces and marinades. The items listed above may not be a complete list of foods  and beverages you should limit. Contact a dietitian for more information. Summary The Mediterranean diet includes both food and lifestyle choices. Eat a variety of fresh fruits and vegetables, beans, nuts, seeds, and whole grains. Limit the amount of red meat and sweets that you eat. If recommended by your health care provider, drink red wine in moderation. This means 1 glass a day for nonpregnant women and 2 glasses a day for men. A glass of wine equals 5 oz (150 mL). This information is not intended to replace advice given to you by your health care provider. Make sure you discuss any questions you have with your health care provider. Document Revised: 03/13/2019 Document Reviewed: 01/08/2019 Elsevier Patient Education  2023 [employer].

## 2021-12-19 ENCOUNTER — Other Ambulatory Visit: Payer: Self-pay

## 2021-12-26 LAB — HYPERCOAGULABLE PANEL, COMPREHENSIVE
APTT: 28.1 s
AT III Act/Nor PPP Chro: 118 %
Act. Prt C Resist w/FV Defic.: 2.6 ratio
Anticardiolipin Ab, IgG: <10 [GPL'U]
Anticardiolipin Ab, IgM: 26 [MPL'U] — ABNORMAL HIGH
Beta-2 Glycoprotein I, IgA: <10 SAU
Beta-2 Glycoprotein I, IgG: <10 SGU
Beta-2 Glycoprotein I, IgM: <10 SMU
DRVVT Screen Seconds: 39.6 s
Factor VII Antigen**: 87 %
Factor VIII Activity: 83 %
Hexagonal Phospholipid Neutral: 7 s
Homocysteine: 11.3 umol/L
Prot C Ag Act/Nor PPP Imm: 95 %
Prot S Ag Act/Nor PPP Imm: 88 %
Protein C Ag/FVII Ag Ratio**: 1.1 ratio
Protein S Ag/FVII Ag Ratio**: 1 ratio

## 2022-01-17 DIAGNOSIS — E669 Obesity, unspecified: Secondary | ICD-10-CM | POA: Diagnosis not present

## 2022-01-17 DIAGNOSIS — I10 Essential (primary) hypertension: Secondary | ICD-10-CM | POA: Diagnosis not present

## 2022-01-17 DIAGNOSIS — G43811 Other migraine, intractable, with status migrainosus: Secondary | ICD-10-CM | POA: Diagnosis not present

## 2022-01-17 DIAGNOSIS — E782 Mixed hyperlipidemia: Secondary | ICD-10-CM | POA: Diagnosis not present

## 2022-04-03 DIAGNOSIS — Z8673 Personal history of transient ischemic attack (TIA), and cerebral infarction without residual deficits: Secondary | ICD-10-CM | POA: Diagnosis not present

## 2022-04-03 DIAGNOSIS — I10 Essential (primary) hypertension: Secondary | ICD-10-CM | POA: Diagnosis not present

## 2022-04-05 DIAGNOSIS — B9689 Other specified bacterial agents as the cause of diseases classified elsewhere: Secondary | ICD-10-CM | POA: Diagnosis not present

## 2022-04-05 DIAGNOSIS — J028 Acute pharyngitis due to other specified organisms: Secondary | ICD-10-CM | POA: Diagnosis not present

## 2022-04-05 DIAGNOSIS — R110 Nausea: Secondary | ICD-10-CM | POA: Diagnosis not present

## 2022-04-05 DIAGNOSIS — R6883 Chills (without fever): Secondary | ICD-10-CM | POA: Diagnosis not present

## 2022-05-11 NOTE — Progress Notes (Unsigned)
NEUROLOGY FOLLOW UP OFFICE NOTE  Betty Dean ZH:[id number]  Assessment/Plan:   Left basal ganglia infarct - likely small vessel given comorbidities, but due to age, would still check hypercoagulable panel. Migraine with and without contrast, without status migrainosus, not intractable. Hypertension Tobacco use disorder Hyperlipidemia   Migraine prevention:  Topiramate 50mg  BID Migraine rescue:  Ubrelvy 100mg  *** Secondary stroke prevention as managed by PCP: ASA 81mg  daily Statin.  LDL goal less than 70 Normotensive blood pressure Hgb A1c goal less than 7 Smoking cessation Mediterranean diet Routine excercise Follow up 4-5 months.       Subjective:  Betty Dean is a 32 year old right-handed female with HTN, tobacco abuse, depression, anxiety, migraine, PCOS and history of stomach ulcer who follows up for stroke and migraine.  UPDATE: Current medications:  ASA 81mg  daily, topiramate 50mg  BID, Ubrelvy ***  Hypercoagulable panel on 12/11/2021 was normal.   ***  Current migraine rescue:  ***.  TRIPTANS CONTRAINDICATED (STROKE) Current migraine preventative:  topiramate 50mg  BID   HISTORY: On 11/11/2021, she developed word-finding difficulty, could not write her name and had difficulty with simple tasks such as typing on her phone.  Also noticed some right arm and hand weakness.  Some difficulty with coordination of her right foot and hand.  No associated headache, dizziness, visual disturbance, unilateral numbness or weakness.  She has history of classic migraines which are manageable.  Denies recent head trauma.  For the previous month, she had been experiencing dizziness and palpitations.  She was evaluated by cardiology and underwent a 4 week cardiac event monitor which revealed 4 PVCs with 1 morphologies but no a fib, AV block or pauses or other significant arrhythmias.  [facility] went to [facility] where she was admitted.  Blood pressure in the ED was 205/112.  CT  perfusion was negative but CT head demonstrated decreased density in the left basal ganglia.  CTA of head and neck revealed  no LVO, hemodynamically significant stenosis or dissection.  MRI of brain confirmed a moderate lacunar infarct within the left basal ganglia.  Echo with bubble study showed EF 55-60% with no PFO.  She was discharged on dual antiplatelet therapy (ASA 81mg  and Plavix) followed by ASA alone.  She was also started on Crestor.     Overall, symptoms are much improved.  Still feel slightly weak in right hand and sometimes may have trouble with words.  Has cut down on smoking from 1 1/2 ppd to 5-6 cigarettes daily.   She also has migraines.  Migraines are severe bilateral retro-orbital pounding pain with nausea, vomiting, photophobia, phonophobia and sometimes with blurred vision or sees stars.  Usually lasts 1-2 days and occurs once a month.  Currently takes topiramate 50mg  BID.  Uses Tylenol or ibuprofen which are not effective.  Previously took rizatriptan and Fioricet.    CT PERFUSION W:  No evidence of ischemia in the sampled regions of the brain CT HEAD WO:  1. No acute cortical stroke. 2. Small region of decreased density in the left basal ganglia and extending into the left periventricular white matter. This may represent lacune or can be seen with demyelinating process. MRI BRAIN WO:  Moderate acute lacunar infarct left basal ganglia to corona radiata. Otherwise, relatively normal appearance of the brain. CTA HEAD & NECK:  No evidence of dissection, occlusion, aneurysm, or hemodynamically significant stenosis of the arteries in the head/neck. ECHO WITH BUBBLE STUDY:  1. Left ventricle: The cavity size is normal.  Systolic function is normal. The estimated ejection fraction is 55-60%. Wall motion is normal; there are no regional wall motion abnormalities. Grade I diastolic dysfunction. 2. Right ventricle: The cavity size is normal. Systolic function is normal. 3. No significant valve  disease is identified. 4. Atrial septum: No defect or patent foramen ovale is identified. Negative bubble study.  [place]:  The patient was monitored for a total of 27d 20h, underlying rhythm is Sinus. The minimum heart rate was 46 bpm; the maximum 143 bpm; the average 76 bpm. No A-fib, AV block, pauses, VT or VF identified. There were a total of 4 PVCs with 1 morphologies and 0 couplets. Overall PVC Burden at < 0.01 % There were a total of 0 Other Beats. There were 0 total number of paced beats. There were a total of 2070 PSVCs with 1 morphologies and 0 couplets. Overall PSVC Burden at 0.07 % There is a total of 6 patient events: Dizziness/near syncope associated with normal sinus rhythm, rapid/fast heartbeat associated with mild sinus tachycardia, palpitations/skipping associated with mild sinus tachycardia. Benign-appearing cardiac event monitor. LABS:  Lipid panel with chol 191, TG 653, HDL 28 and direct-LDL 118;   PAST MEDICAL HISTORY: Past Medical History:  Diagnosis Date   Anxious mood    Depression    GERD (gastroesophageal reflux disease)    Hypertension    Migraine    "maybe once/wk" (10/15/2014)   Polycystic ovarian syndrome    Stomach ulcer     MEDICATIONS: Current Outpatient Medications on File Prior to Visit  Medication Sig Dispense Refill   acetaminophen (TYLENOL) 325 MG tablet Take 650 mg by mouth every 6 (six) hours as needed for mild pain, moderate pain or headache.      amLODipine (NORVASC) 5 MG tablet Take 5 mg by mouth daily.     ASPIRIN 81 PO Take by mouth.     buPROPion (WELLBUTRIN XL) 150 MG 24 hr tablet Take 150 mg by mouth every morning.     buPROPion (WELLBUTRIN) 100 MG tablet Take 100 mg by mouth 2 (two) times daily.     butalbital-acetaminophen-caffeine (FIORICET) 50-325-40 MG tablet Take by mouth.     cyclobenzaprine (FLEXERIL) 10 MG tablet Take 10 mg by mouth 2 (two) times daily as needed.     meloxicam (MOBIC) 15 MG tablet Take by mouth.      rizatriptan (MAXALT-MLT) 10 MG disintegrating tablet Take 1 tablet (10 mg total) by mouth as needed for migraine. May repeat in 2 hours if needed (Patient not taking: Reported on 12/12/2021) 9 tablet 11   rosuvastatin (CRESTOR) 10 MG tablet Take 10 mg by mouth daily.     topiramate (TOPAMAX) 50 MG tablet Take 1 tablet (50 mg total) by mouth 2 (two) times daily. 60 tablet 12   No current facility-administered medications on file prior to visit.    ALLERGIES: Allergies  Allergen Reactions   Latex Itching, Rash and Hives   Shellfish Allergy Itching and Swelling    Rash / Swelling Rash / Swelling     FAMILY HISTORY: Family History  Problem Relation Age of Onset   Migraines Mother    Seizures Brother    Migraines Maternal Aunt    Multiple sclerosis Maternal Grandmother    Migraines Maternal Grandmother       Objective:  *** General: No acute distress.  Patient appears ***-groomed.   Head:  Normocephalic/atraumatic Eyes:  Fundi examined but not visualized Neck: supple, no paraspinal tenderness, full  range of motion Heart:  Regular rate and rhythm Lungs:  Clear to auscultation bilaterally Back: No paraspinal tenderness Neurological Exam: alert and oriented to person, place, and time.  Speech fluent and not dysarthric, language intact.  CN II-XII intact. Bulk and tone normal, muscle strength 5/5 throughout.  Sensation to light touch intact.  Deep tendon reflexes 2+ throughout, toes downgoing.  Finger to nose testing intact.  Gait normal, Romberg negative.   Metta Clines, DO  CC: ***

## 2022-05-14 ENCOUNTER — Other Ambulatory Visit (INDEPENDENT_AMBULATORY_CARE_PROVIDER_SITE_OTHER): Payer: Medicaid Other

## 2022-05-14 ENCOUNTER — Encounter: Payer: Self-pay | Admitting: Neurology

## 2022-05-14 ENCOUNTER — Ambulatory Visit: Payer: Medicaid Other | Admitting: Neurology

## 2022-05-14 VITALS — BP 147/94 | HR 86 | Resp 20 | Ht 66.0 in | Wt 192.0 lb

## 2022-05-14 DIAGNOSIS — E785 Hyperlipidemia, unspecified: Secondary | ICD-10-CM | POA: Diagnosis not present

## 2022-05-14 DIAGNOSIS — G43009 Migraine without aura, not intractable, without status migrainosus: Secondary | ICD-10-CM

## 2022-05-14 DIAGNOSIS — I10 Essential (primary) hypertension: Secondary | ICD-10-CM

## 2022-05-14 DIAGNOSIS — F17200 Nicotine dependence, unspecified, uncomplicated: Secondary | ICD-10-CM | POA: Diagnosis not present

## 2022-05-14 DIAGNOSIS — I6381 Other cerebral infarction due to occlusion or stenosis of small artery: Secondary | ICD-10-CM | POA: Diagnosis not present

## 2022-05-14 LAB — HEPATIC FUNCTION PANEL
ALT: 21 U/L (ref 0–35)
AST: 19 U/L (ref 0–37)
Albumin: 4.3 g/dL (ref 3.5–5.2)
Alkaline Phosphatase: 81 U/L (ref 39–117)
Bilirubin, Direct: 0.1 mg/dL (ref 0.0–0.3)
Total Bilirubin: 0.3 mg/dL (ref 0.2–1.2)
Total Protein: 7.1 g/dL (ref 6.0–8.3)

## 2022-05-14 LAB — LIPID PANEL
Cholesterol: 197 mg/dL (ref 0–200)
HDL: 36.8 mg/dL — ABNORMAL LOW (ref >39.00)
NonHDL: 159.84
Total CHOL/HDL Ratio: 5
Triglycerides: 203 mg/dL — ABNORMAL HIGH (ref 0.0–149.0)
VLDL: 40.6 mg/dL — ABNORMAL HIGH (ref 0.0–40.0)

## 2022-05-14 LAB — LDL CHOLESTEROL, DIRECT: Direct LDL: 135 mg/dL

## 2022-05-14 MED ORDER — ROSUVASTATIN CALCIUM 10 MG PO TABS: 10.0000 mg | ORAL_TABLET | Freq: Every day | ORAL | 2 refills | Status: DC

## 2022-05-14 MED ORDER — PROPRANOLOL HCL 40 MG PO TABS
40.0000 mg | ORAL_TABLET | Freq: Two times a day (BID) | ORAL | 5 refills | Status: DC
Start: 2022-05-14 — End: 2022-07-20

## 2022-05-14 MED ORDER — UBRELVY 100 MG PO TABS: 1.0000 | ORAL_TABLET | ORAL | 11 refills | Status: DC | PRN

## 2022-05-14 NOTE — Patient Instructions (Addendum)
Start propranolol 40mg  tablet - 1/2 tablet twice daily for a week, then increase to 1 tablet twice daily.  If no improvement in headaches in 8 weeks, contact me.   Start aspirin 81mg  daily.  Restart rosuvastatin (Crestor) 10mg  daily.  Check lipid panel and hepatic function.   For migraine rescue:  Ubrelvy 100mg  - take as needed.  Repeat after 2hours if needed.  Maximum 2 tablets in 24 hours. Quit smoking Please establish care with a primary care provider

## 2022-06-14 ENCOUNTER — Telehealth: Payer: Self-pay

## 2022-06-14 ENCOUNTER — Telehealth: Payer: Self-pay | Admitting: Anesthesiology

## 2022-06-14 NOTE — Telephone Encounter (Signed)
Pt left message stating her Bernita Raisin needs a PA.

## 2022-06-14 NOTE — Telephone Encounter (Signed)
Sent to PA team

## 2022-06-15 NOTE — Telephone Encounter (Signed)
Pt called in again and left a message. She stated her prescription has not been approved. Her insurance sent the office an authorization form and there is a time limit.

## 2022-06-15 NOTE — Telephone Encounter (Signed)
Pt called back in stating the insurance wants someone to give them a call if it's going to be over 24 hours for the auth to be sent back to them, then call them so they don't have to send the form again.

## 2022-06-20 ENCOUNTER — Telehealth: Payer: Self-pay

## 2022-06-20 ENCOUNTER — Encounter: Payer: Self-pay | Admitting: Neurology

## 2022-06-20 NOTE — Telephone Encounter (Signed)
PA has been faxed to plan and is pending determination. Additional encounter created

## 2022-06-20 NOTE — Telephone Encounter (Signed)
PA started in 5/1 Encounter

## 2022-06-20 NOTE — Telephone Encounter (Signed)
PA request received via CMM/provider for Ubrelvy 100MG  tablets  PA has been submitted to [employer]  [employer] via fax through [facility] and is pending determination  Key: [id number]

## 2022-06-21 ENCOUNTER — Encounter: Payer: Self-pay | Admitting: Family Medicine

## 2022-06-21 ENCOUNTER — Ambulatory Visit: Payer: Medicaid Other | Admitting: Family Medicine

## 2022-06-21 VITALS — BP 125/85 | HR 62 | Ht 65.0 in | Wt 191.0 lb

## 2022-06-21 DIAGNOSIS — E7800 Pure hypercholesterolemia, unspecified: Secondary | ICD-10-CM

## 2022-06-21 DIAGNOSIS — I10 Essential (primary) hypertension: Secondary | ICD-10-CM | POA: Diagnosis not present

## 2022-06-21 DIAGNOSIS — L923 Foreign body granuloma of the skin and subcutaneous tissue: Secondary | ICD-10-CM | POA: Diagnosis not present

## 2022-06-21 DIAGNOSIS — Z7689 Persons encountering health services in other specified circumstances: Secondary | ICD-10-CM

## 2022-06-21 DIAGNOSIS — Z0000 Encounter for general adult medical examination without abnormal findings: Secondary | ICD-10-CM

## 2022-06-21 DIAGNOSIS — E669 Obesity, unspecified: Secondary | ICD-10-CM | POA: Diagnosis not present

## 2022-06-21 DIAGNOSIS — Z23 Encounter for immunization: Secondary | ICD-10-CM

## 2022-06-21 DIAGNOSIS — Z6831 Body mass index (BMI) 31.0-31.9, adult: Secondary | ICD-10-CM

## 2022-06-21 DIAGNOSIS — Z0001 Encounter for general adult medical examination with abnormal findings: Secondary | ICD-10-CM | POA: Diagnosis not present

## 2022-06-21 LAB — COMPREHENSIVE METABOLIC PANEL
ALT: 21 U/L (ref 0–35)
AST: 15 U/L (ref 0–37)
Albumin: 4.1 g/dL (ref 3.5–5.2)
Alkaline Phosphatase: 81 U/L (ref 39–117)
BUN: 15 mg/dL (ref 6–23)
CO2: 30 mEq/L (ref 19–32)
Calcium: 9 mg/dL (ref 8.4–10.5)
Chloride: 103 mEq/L (ref 96–112)
Creatinine, Ser: 0.83 mg/dL (ref 0.40–1.20)
GFR: 92.97 mL/min (ref >60.00)
Glucose, Bld: 114 mg/dL — ABNORMAL HIGH (ref 70–99)
Potassium: 4.1 mEq/L (ref 3.5–5.1)
Sodium: 141 mEq/L (ref 135–145)
Total Bilirubin: 0.3 mg/dL (ref 0.2–1.2)
Total Protein: 6.6 g/dL (ref 6.0–8.3)

## 2022-06-21 LAB — CBC
HCT: 38.4 % (ref 36.0–46.0)
Hemoglobin: 12.7 g/dL (ref 12.0–15.0)
MCHC: 33.2 g/dL (ref 30.0–36.0)
MCV: 85.9 fl (ref 78.0–100.0)
Platelets: 204 10*3/uL (ref 150.0–400.0)
RBC: 4.47 Mil/uL (ref 3.87–5.11)
RDW: 13.9 % (ref 11.5–15.5)
WBC: 11.5 10*3/uL — ABNORMAL HIGH (ref 4.0–10.5)

## 2022-06-21 LAB — TSH: TSH: 1.43 u[IU]/mL (ref 0.35–5.50)

## 2022-06-21 MED ORDER — MUPIROCIN 2 % EX OINT
1.0000 | TOPICAL_OINTMENT | Freq: Three times a day (TID) | CUTANEOUS | 0 refills | Status: DC
Start: 2022-06-21 — End: 2022-08-29

## 2022-06-21 MED ORDER — PHENTERMINE HCL 37.5 MG PO CAPS: 37.5000 mg | ORAL_CAPSULE | ORAL | 2 refills | Status: DC

## 2022-06-21 NOTE — Progress Notes (Signed)
Office Note 06/21/2022  CC:  Chief Complaint  Patient presents with   Establish Care    New pt get established. Sleep and energy concerns. Also wants to discuss anxiety and depression. She has a navel piercing that may be infected.    HPI:  Betty Dean is a 32 y.o. White female who is here to establish care and get health maintenance exam and discuss weight management. Patient's most recent primary MD: [facility]. Old records were reviewed prior to or during today's visit.  Has struggled with weight all her life. Dieting has not helped.  She does not exercise.  She found the phentermine helpful and was getting this prescribed by her prior PCP.  It also helped with focus. PMP AWARE reviewed today: most recent rx for phentermine was filled 03/31/2022, # 30, rx by Lenise Herald in [place], [place].. No red flags.  She requests to get back on phentermine.  Past Medical History:  Diagnosis Date   Anxiety and depression    GERD (gastroesophageal reflux disease)    History of CVA (cerebrovascular accident)    L basal ganglia 10/2021  Echo normal.   Hypercholesterolemia    Hypertension    Migraine    "maybe once/wk" (10/15/2014)   Palpitations    Event monitor 2023/2024 benign   Peptic ulcer disease    Polycystic ovarian syndrome    Recurrent UTI     Past Surgical History:  Procedure Laterality Date   BLADDER REPAIR     CESAREAN SECTION  2021   CHOLECYSTECTOMY  2022   CYSTOSCOPY  2022   TUBAL LIGATION Bilateral 2021    Family History  Problem Relation Age of Onset   Migraines Mother    Depression Mother    Diabetes Mother    Hyperlipidemia Mother    Hypertension Mother    Mental illness Mother    Alcohol abuse Father    Seizures Brother    Multiple sclerosis Maternal Grandmother    Migraines Maternal Grandmother    Arthritis Maternal Grandmother    Depression Maternal Grandmother    Early death Maternal Grandmother    Hyperlipidemia  Maternal Grandmother    Hypertension Maternal Grandmother    Kidney disease Maternal Grandmother    Mental illness Maternal Grandmother    Heart attack Maternal Grandfather    Heart disease Maternal Grandfather    Migraines Maternal Aunt    Depression Daughter    Learning disabilities Daughter    Mental illness Daughter    Asthma Son    Birth defects Son    Learning disabilities Son     Social History   Socioeconomic History   Marital status: Divorced    Spouse name: Not on file   Number of children: Not on file   Years of education: Not on file   Highest education level: Not on file  Occupational History   Not on file  Tobacco Use   Smoking status: Every Day    Packs/day: 1.00    Years: 2.00    Additional pack years: 0.00    Total pack years: 2.00    Types: Cigarettes   Smokeless tobacco: Never  Substance and Sexual Activity   Alcohol use: Yes    Alcohol/week: 0.0 standard drinks of alcohol    Comment: 10/15/2014 "might drink once/year"   Drug use: Not Currently    Types: Marijuana    Comment: 10/15/2014 "pot maybe once/2 weeks"   Sexual activity: Yes    Partners: Female,  Female    Birth control/protection: Surgical  Other Topics Concern   Not on file  Social History Narrative   Single, has 1 son and 1 daughter as of 2024.   Originally from [place], raised in [place].  Lives in [place].   Education: Some college   Occupation: homemaker   No alcohol or drugs.   Tobacco: 30 pack-yr hx, active 2024   Social Determinants of Health   Financial Resource Strain: Not on file  Food Insecurity: Not on file  Transportation Needs: Not on file  Physical Activity: Not on file  Stress: Not on file  Social Connections: Not on file  Intimate Partner Violence: Not on file    Outpatient Encounter Medications as of 06/21/2022  Medication Sig   acetaminophen (TYLENOL) 325 MG tablet Take 650 mg by mouth every 6 (six) hours as needed for mild pain, moderate pain or  headache.    amLODipine (NORVASC) 5 MG tablet Take 5 mg by mouth daily.   Ascorbic Acid (VITAMIN C PO) Take by mouth daily.   ASPIRIN 81 PO Take by mouth.   butalbital-acetaminophen-caffeine (FIORICET) 50-325-40 MG tablet Take by mouth.   Ca Carbonate-Mag Hydroxide (ROLAIDS PO) Take by mouth as needed.   cyclobenzaprine (FLEXERIL) 10 MG tablet Take 10 mg by mouth 2 (two) times daily as needed.   meloxicam (MOBIC) 15 MG tablet Take by mouth.   mupirocin ointment (BACTROBAN) 2 % Apply 1 Application topically 3 (three) times daily. For 1 week   phentermine 37.5 MG capsule Take 1 capsule (37.5 mg total) by mouth every morning.   propranolol (INDERAL) 40 MG tablet Take 1 tablet (40 mg total) by mouth 2 (two) times daily.   rosuvastatin (CRESTOR) 40 MG tablet Take 40 mg by mouth daily.   Ubrogepant (UBRELVY) 100 MG TABS Take 1 tablet (100 mg total) by mouth as needed. May repeat after 2 hours.  Maximum 2 tablets in 24 hours.   Vitamin D-Vitamin K (VITAMIN K2-VITAMIN D3 PO) Take by mouth daily. 10,000 iu +   No facility-administered encounter medications on file as of 06/21/2022.    Allergies  Allergen Reactions   Latex Itching, Rash and Hives   Shellfish Allergy Itching and Swelling    Rash / Swelling  Rash / Swelling Rash / Swelling    Review of Systems  Constitutional:  Negative for appetite change, chills, fatigue and fever.  HENT:  Negative for congestion, dental problem, ear pain and sore throat.   Eyes:  Negative for discharge, redness and visual disturbance.  Respiratory:  Negative for cough, chest tightness, shortness of breath and wheezing.   Cardiovascular:  Negative for chest pain, palpitations and leg swelling.  Gastrointestinal:  Negative for abdominal pain, blood in stool, diarrhea, nausea and vomiting.  Genitourinary:  Negative for difficulty urinating, dysuria, flank pain, frequency, hematuria and urgency.  Musculoskeletal:  Negative for arthralgias, back pain, joint  swelling, myalgias and neck stiffness.  Skin:  Negative for pallor and rash.  Neurological:  Negative for dizziness, speech difficulty, weakness and headaches.  Hematological:  Negative for adenopathy. Does not bruise/bleed easily.  Psychiatric/Behavioral:  Negative for confusion and sleep disturbance. The patient is not nervous/anxious.     PE; Blood pressure 125/85, pulse 62, height 5\' 5"  (1.651 m), weight 191 lb (86.6 kg), SpO2 100 %. Body mass index is 31.78 kg/m.  Physical Exam  Exam chaperoned by Sammuel Cooper, CMA Gen: Alert, well appearing.  Patient is oriented to person, place, time, and situation.  AFFECT: pleasant, lucid thought and speech. ENT: Ears: EACs clear, normal epithelium.  TMs with good light reflex and landmarks bilaterally.  Eyes: no injection, icteris, swelling, or exudate.  EOMI, PERRLA. Nose: no drainage or turbinate edema/swelling.  No injection or focal lesion.  Mouth: lips without lesion/swelling.  Oral mucosa pink and moist.  Dentition intact and without obvious caries or gingival swelling.  Oropharynx without erythema, exudate, or swelling.  Neck: supple/nontender.  No LAD, mass, or TM.  Carotid pulses 2+ bilaterally, without bruits. CV: RRR, no m/r/g.   LUNGS: CTA bilat, nonlabored resps, good aeration in all lung fields. ABD: soft, NT, ND, BS normal.  No hepatospenomegaly or mass.  No bruits. EXT: no clubbing, cyanosis, or edema.  Musculoskeletal: no joint swelling, erythema, warmth, or tenderness.  ROM of all joints intact. Skin -umbilicus with small pinkish swelling about 2 cm diameter with a piercing going straight through it.  No pustular drainage.  No tenderness. otherwise, no sores or suspicious lesions or rashes or color changes  Pertinent labs:  Last CBC Lab Results  Component Value Date   WBC 11.6 (H) 07/15/2021   HGB 12.5 07/15/2021   HCT 38.3 07/15/2021   MCV 84.9 07/15/2021   MCH 27.7 07/15/2021   RDW 13.8 07/15/2021   PLT 239  07/15/2021   Last metabolic panel Lab Results  Component Value Date   GLUCOSE 114 (H) 07/15/2021   NA 141 07/15/2021   K 3.6 07/15/2021   CL 104 07/15/2021   CO2 29 07/15/2021   BUN 16 07/15/2021   CREATININE 0.92 07/15/2021   GFRNONAA >60 07/15/2021   CALCIUM 9.3 07/15/2021   PHOS 2.7 10/15/2014   PROT 7.1 05/14/2022   ALBUMIN 4.3 05/14/2022   BILITOT 0.3 05/14/2022   ALKPHOS 81 05/14/2022   AST 19 05/14/2022   ALT 21 05/14/2022   ANIONGAP 8 07/15/2021   Last lipids Lab Results  Component Value Date   CHOL 197 05/14/2022   HDL 36.80 (L) 05/14/2022   LDLDIRECT 135.0 05/14/2022   TRIG 203.0 (H) 05/14/2022   CHOLHDL 5 05/14/2022   Last thyroid functions Lab Results  Component Value Date   TSH 0.414 10/15/2014    ASSESSMENT AND PLAN:   New patient, establishing care.  #1 Health maintenance exam: Reviewed age and gender appropriate health maintenance issues (prudent diet, regular exercise, health risks of tobacco and excessive alcohol, use of seatbelts, fire alarms in home, use of sunscreen).  Also reviewed age and gender appropriate health screening as well as vaccine recommendations. Vaccines: Prevnar 20 today (hx CVA) Labs: CMET, CBC, TSH. Cervical ca screening: She has GYN, states that she needs to make appointment. Breast ca screening: Start annual screening mammogram age 28  #2 weight management/class I obesity.  She states she had a 30 pound successful weight loss with taking phentermine in the past. Restart phentermine 37.5 daily.  #3 hypertension, well-controlled on amlodipine 5 mg daily.  4.  Hypercholesterolemia, tolerating rosuvastatin 40 mg a day. Lipid panel a couple of months ago showed triglycerides 203 and LDL.  Not sure if dose was increased at that time.  We will have to address this at future visit.  Goal LDL less than 70.  5.  Foreign body reaction with possible infection--umbilical piercing. I recommended she take the piercing out.  Warm  compresses to the area recommended.  Bactroban 3 times daily prescribed.  An After Visit Summary was printed and given to the patient.  Return for 3 mo f/u [facility]  but pt can make appt at her convenience to discuss any new issues/problems.  Signed:  Santiago Bumpers, MD           06/21/2022

## 2022-06-22 ENCOUNTER — Other Ambulatory Visit: Payer: Self-pay

## 2022-06-22 ENCOUNTER — Other Ambulatory Visit (INDEPENDENT_AMBULATORY_CARE_PROVIDER_SITE_OTHER): Payer: Medicaid Other

## 2022-06-22 DIAGNOSIS — E7800 Pure hypercholesterolemia, unspecified: Secondary | ICD-10-CM | POA: Diagnosis not present

## 2022-06-22 DIAGNOSIS — I10 Essential (primary) hypertension: Secondary | ICD-10-CM | POA: Diagnosis not present

## 2022-06-22 LAB — HEMOGLOBIN A1C: Hgb A1c MFr Bld: 6.4 % (ref 4.6–6.5)

## 2022-06-22 MED ORDER — METFORMIN HCL 500 MG PO TABS: 500.0000 mg | ORAL_TABLET | Freq: Every day | ORAL | 2 refills | Status: DC

## 2022-06-25 ENCOUNTER — Encounter: Payer: Self-pay | Admitting: Family Medicine

## 2022-07-03 NOTE — Telephone Encounter (Signed)
Additional information and chart notes faxed to [employer] at [phone].

## 2022-07-11 ENCOUNTER — Other Ambulatory Visit (HOSPITAL_COMMUNITY): Payer: Self-pay

## 2022-07-13 NOTE — Patient Instructions (Addendum)
Remember to take 81mg  aspirin every day.

## 2022-07-20 ENCOUNTER — Encounter: Payer: Self-pay | Admitting: Family Medicine

## 2022-07-20 ENCOUNTER — Ambulatory Visit: Payer: Medicaid Other | Admitting: Family Medicine

## 2022-07-20 VITALS — BP 135/82 | HR 85 | Wt 178.2 lb

## 2022-07-20 DIAGNOSIS — G4733 Obstructive sleep apnea (adult) (pediatric): Secondary | ICD-10-CM | POA: Diagnosis not present

## 2022-07-20 DIAGNOSIS — G43909 Migraine, unspecified, not intractable, without status migrainosus: Secondary | ICD-10-CM

## 2022-07-20 DIAGNOSIS — G44209 Tension-type headache, unspecified, not intractable: Secondary | ICD-10-CM

## 2022-07-20 DIAGNOSIS — F5105 Insomnia due to other mental disorder: Secondary | ICD-10-CM | POA: Diagnosis not present

## 2022-07-20 DIAGNOSIS — F99 Mental disorder, not otherwise specified: Secondary | ICD-10-CM | POA: Diagnosis not present

## 2022-07-20 DIAGNOSIS — E7800 Pure hypercholesterolemia, unspecified: Secondary | ICD-10-CM

## 2022-07-20 DIAGNOSIS — R7303 Prediabetes: Secondary | ICD-10-CM | POA: Diagnosis not present

## 2022-07-20 DIAGNOSIS — F411 Generalized anxiety disorder: Secondary | ICD-10-CM | POA: Diagnosis not present

## 2022-07-20 DIAGNOSIS — F410 Panic disorder [episodic paroxysmal anxiety] without agoraphobia: Secondary | ICD-10-CM | POA: Diagnosis not present

## 2022-07-20 MED ORDER — ROSUVASTATIN CALCIUM 40 MG PO TABS: 40.0000 mg | ORAL_TABLET | Freq: Every day | ORAL | 1 refills | Status: DC

## 2022-07-20 MED ORDER — METFORMIN HCL 500 MG PO TABS: 500.0000 mg | ORAL_TABLET | Freq: Every day | ORAL | 1 refills | Status: DC

## 2022-07-20 MED ORDER — AMLODIPINE BESYLATE 5 MG PO TABS: 5.0000 mg | ORAL_TABLET | Freq: Every day | ORAL | 1 refills | Status: DC

## 2022-07-20 MED ORDER — UBRELVY 100 MG PO TABS: 1.0000 | ORAL_TABLET | ORAL | 5 refills | Status: DC | PRN

## 2022-07-20 MED ORDER — LORAZEPAM 0.5 MG PO TABS: ORAL_TABLET | ORAL | 1 refills | Status: DC

## 2022-07-20 NOTE — Progress Notes (Signed)
OFFICE VISIT  07/20/2022  CC:  Chief Complaint  Patient presents with   Follow-up    Wants to discuss issues with sleep, anxiety, migraines and constipation. She does have a neurologist but states she has not be able to contact him.    Patient is a 32 y.o. female who presents accompanied by her brother for multiple concerns.  HPI: At her visit to establish care about 1 month ago her hemoglobin A1c returned at 6.4%.  We started her on metformin.  Chronic poor sleep initiation and maintenance.  No RLS sx's. Otc sleep aid no help.  Chronic mixed migraine/tension headaches.  Propranolol started by neuro 05/14/22 made headaches worse. Ubrelvy samples helped but she can't afford this med. Bright light, heat, and stress/anxiety trigger these. Recently took an oxycodone from old rx, helpful. She reports loud snoring, has been told she has apneic events in sleep.  +chronic daytime sleepiness and chronic fatigue.  Describes long hx of anxiety, near panic, depression.  Lately more stress-->more near-panic. Feels miserable with this, but says depression is not a big feature of things at this time. She is very apprehensive about antidepressants, says they have never made her feel better.  In fact, often made affect flat/blunted emotions (she can recall only wellbutrin and sertraline but says many "others" tried).   PMP AWARE reviewed today: most recent rx for phentermine was filled 06/21/2022, # 30, rx by me. No red flags.  ROS as above, plus--> no dizziness, No myalgias or arthralgias.  No focal weakness, paresthesias, or tremors.  No palpitations.    Past Medical History:  Diagnosis Date   Anxiety and depression    GERD (gastroesophageal reflux disease)    History of CVA (cerebrovascular accident)    L basal ganglia 10/2021  Echo normal.   Hypercholesterolemia    Hypertension    Migraine    "maybe once/wk" (10/15/2014)   Palpitations    Event monitor 2023/2024 benign   Peptic ulcer  disease    Polycystic ovarian syndrome    Prediabetes    06/2022 a1c 6.4%-->metformin started   Recurrent UTI     Past Surgical History:  Procedure Laterality Date   BLADDER REPAIR     CESAREAN SECTION  2021   CHOLECYSTECTOMY  2022   CYSTOSCOPY  2022   TUBAL LIGATION Bilateral 2021    Outpatient Medications Prior to Visit  Medication Sig Dispense Refill   acetaminophen (TYLENOL) 325 MG tablet Take 650 mg by mouth every 6 (six) hours as needed for mild pain, moderate pain or headache.      amLODipine (NORVASC) 5 MG tablet Take 5 mg by mouth daily.     Ascorbic Acid (VITAMIN C PO) Take by mouth daily.     ASPIRIN 81 PO Take by mouth.     butalbital-acetaminophen-caffeine (FIORICET) 50-325-40 MG tablet Take by mouth.     Ca Carbonate-Mag Hydroxide (ROLAIDS PO) Take by mouth as needed.     cyclobenzaprine (FLEXERIL) 10 MG tablet Take 10 mg by mouth 2 (two) times daily as needed.     meloxicam (MOBIC) 15 MG tablet Take by mouth.     metFORMIN (GLUCOPHAGE) 500 MG tablet Take 1 tablet (500 mg total) by mouth daily. 30 tablet 2   mupirocin ointment (BACTROBAN) 2 % Apply 1 Application topically 3 (three) times daily. For 1 week 15 g 0   phentermine 37.5 MG capsule Take 1 capsule (37.5 mg total) by mouth every morning. 30 capsule 2   rosuvastatin (CRESTOR)  40 MG tablet Take 40 mg by mouth daily.     Ubrogepant (UBRELVY) 100 MG TABS Take 1 tablet (100 mg total) by mouth as needed. May repeat after 2 hours.  Maximum 2 tablets in 24 hours. 16 tablet 11   Vitamin D-Vitamin K (VITAMIN K2-VITAMIN D3 PO) Take by mouth daily. 10,000 iu +     propranolol (INDERAL) 40 MG tablet Take 1 tablet (40 mg total) by mouth 2 (two) times daily. (Patient not taking: Reported on 07/20/2022) 60 tablet 5   No facility-administered medications prior to visit.    Allergies  Allergen Reactions   Latex Itching, Rash and Hives   Shellfish Allergy Itching and Swelling    Rash / Swelling  Rash / Swelling Rash  / Swelling    Review of Systems  As per HPI  PE:    07/20/2022    9:52 AM 06/21/2022   10:09 AM 05/14/2022    1:50 PM  Vitals with BMI  Height  5\' 5"    Weight 178 lbs 3 oz 191 lbs   BMI 29.65 31.78   Systolic 135 125 027  Diastolic 82 85 94  Pulse 85 62      Physical Exam  Gen: alert, well appearing, mildly anxious. Attentive, calm. No further exam today.  LABS:  Last CBC Lab Results  Component Value Date   WBC 11.5 (H) 06/21/2022   HGB 12.7 06/21/2022   HCT 38.4 06/21/2022   MCV 85.9 06/21/2022   MCH 27.7 07/15/2021   RDW 13.9 06/21/2022   PLT 204.0 06/21/2022   Last metabolic panel Lab Results  Component Value Date   GLUCOSE 114 (H) 06/21/2022   NA 141 06/21/2022   K 4.1 06/21/2022   CL 103 06/21/2022   CO2 30 06/21/2022   BUN 15 06/21/2022   CREATININE 0.83 06/21/2022   GFRNONAA >60 07/15/2021   CALCIUM 9.0 06/21/2022   PHOS 2.7 10/15/2014   PROT 6.6 06/21/2022   ALBUMIN 4.1 06/21/2022   BILITOT 0.3 06/21/2022   ALKPHOS 81 06/21/2022   AST 15 06/21/2022   ALT 21 06/21/2022   ANIONGAP 8 07/15/2021   Last lipids Lab Results  Component Value Date   CHOL 197 05/14/2022   HDL 36.80 (L) 05/14/2022   LDLDIRECT 135.0 05/14/2022   TRIG 203.0 (H) 05/14/2022   CHOLHDL 5 05/14/2022   Last hemoglobin A1c Lab Results  Component Value Date   HGBA1C 6.4 06/22/2022   Last thyroid functions Lab Results  Component Value Date   TSH 1.43 06/21/2022   IMPRESSION AND PLAN:  1) Hypercholesterolemia: tolerating rosuvastatin 40 mg a day. Lipid panel a couple of months ago showed triglycerides 203 and LDL135.  Not sure if dose was increased at that time.    Goal LDL less than 70 d/t hx CVA. Not fasting today.  Rpt 3 mo. Cont crestor 40mg  qd.  2) Prediabetes, just started metformin 500mg  and tolerating fine. Plan rpt A1c 3 mo.  3) Mixed migraine/tension headache syndrome. Propranolol recently worsened these. Bernita Raisin helped but too costly.  We can try  another rx and see if we can get it covered with prior auth. Triptans contraindicated d/t her hx of CVA. I recommended she not use oxycodone for headaches. She will try to arrange f/u with neurologist, Dr. Everlena Cooper.  4) GAD, panic, recurrent MDD. As per HPI, no antidepressant, will rx lorazepam 0.5mg , 1 bid prn, #30, RF x 1. Discussed cautious prn use only.  5) Insomnia, d/t #4 above.  We'll see how lorazepam in evenings prn helps this.  6) OSA suspected-->referred to pulm today.  An After Visit Summary was printed and given to the patient.  FOLLOW UP: 4 wks f/u headaches and anxiety  Signed:  Santiago Bumpers, MD           07/20/2022

## 2022-07-26 DIAGNOSIS — B9689 Other specified bacterial agents as the cause of diseases classified elsewhere: Secondary | ICD-10-CM | POA: Diagnosis not present

## 2022-07-26 DIAGNOSIS — J019 Acute sinusitis, unspecified: Secondary | ICD-10-CM | POA: Diagnosis not present

## 2022-07-26 DIAGNOSIS — R059 Cough, unspecified: Secondary | ICD-10-CM | POA: Diagnosis not present

## 2022-07-26 DIAGNOSIS — J029 Acute pharyngitis, unspecified: Secondary | ICD-10-CM | POA: Diagnosis not present

## 2022-07-30 ENCOUNTER — Telehealth: Payer: Self-pay

## 2022-07-30 ENCOUNTER — Other Ambulatory Visit (HOSPITAL_COMMUNITY): Payer: Self-pay

## 2022-07-30 NOTE — Telephone Encounter (Signed)
Patient Advocate Encounter   Received notification from [employer] [facility] [employer] that prior authorization is required for Ubrelvy 100MG  tablets   Submitted: 07-30-2022 Key [id number]  Status is pending

## 2022-08-14 NOTE — Telephone Encounter (Signed)
Med being resubmitted in additional encounter

## 2022-08-15 ENCOUNTER — Telehealth: Payer: Self-pay

## 2022-08-15 NOTE — Telephone Encounter (Signed)
A user error has taken place: encounter opened in error, closed for administrative reasons.

## 2022-08-24 NOTE — Patient Instructions (Signed)

## 2022-08-29 ENCOUNTER — Ambulatory Visit: Payer: Medicaid Other | Admitting: Family Medicine

## 2022-08-29 VITALS — BP 149/99 | HR 82 | Wt 183.6 lb

## 2022-08-29 DIAGNOSIS — Z79899 Other long term (current) drug therapy: Secondary | ICD-10-CM

## 2022-08-29 DIAGNOSIS — R7303 Prediabetes: Secondary | ICD-10-CM | POA: Diagnosis not present

## 2022-08-29 DIAGNOSIS — Z7689 Persons encountering health services in other specified circumstances: Secondary | ICD-10-CM | POA: Diagnosis not present

## 2022-08-29 DIAGNOSIS — E559 Vitamin D deficiency, unspecified: Secondary | ICD-10-CM

## 2022-08-29 DIAGNOSIS — F411 Generalized anxiety disorder: Secondary | ICD-10-CM

## 2022-08-29 LAB — VITAMIN D 25 HYDROXY (VIT D DEFICIENCY, FRACTURES): VITD: 75.62 ng/mL (ref 30.00–100.00)

## 2022-08-29 MED ORDER — LORAZEPAM 0.5 MG PO TABS: ORAL_TABLET | ORAL | 1 refills | Status: DC

## 2022-08-29 MED ORDER — METFORMIN HCL 500 MG PO TABS: 500.0000 mg | ORAL_TABLET | Freq: Two times a day (BID) | ORAL | 1 refills | Status: DC

## 2022-08-29 MED ORDER — PHENTERMINE HCL 37.5 MG PO CAPS: 37.5000 mg | ORAL_CAPSULE | ORAL | 1 refills | Status: DC

## 2022-08-29 NOTE — Progress Notes (Signed)
OFFICE VISIT  08/29/2022  CC:  Chief Complaint  Patient presents with   Anxiety    Would like to get Vitamin D checked.     Patient is a 32 y.o. female who presents for 6-week follow-up hypercholesterolemia, HTN, prediabetes, anxiety with panic,recurrent major depressive disorder, and insomnia.  INTERIM HX: She is doing pretty well.  She had a great time on her cruise. Lorazepam helping well for anxiety and insomnia.  Mood is stable.  I referred her to pulmonology last visit for suspected obstructive sleep apnea. Still has chronic fatigue/some excessive daytime sleepiness--has initial appointment for evaluation with pulmonology next week.   PMP AWARE reviewed today: most recent rx for lorazepam 0.5 mg was filled 08/15/2022, # 30, rx by me. Most recent phentermine prescription filled 07/20/2022, #30, prescription by me. No red flags.  ROS -> no fevers, no CP, no SOB, no wheezing, no cough, no dizziness,no rashes, no melena/hematochezia.  No polyuria or polydipsia.  No myalgias or arthralgias.  No focal weakness, paresthesias, or tremors.  No acute vision or hearing abnormalities.  No dysuria or unusual/new urinary urgency or frequency.  No recent changes in lower legs. No n/v/d or abd pain.  No palpitations.     Past Medical History:  Diagnosis Date   Anxiety and depression    GERD (gastroesophageal reflux disease)    History of CVA (cerebrovascular accident)    L basal ganglia 10/2021  Echo normal.   Hypercholesterolemia    Hypertension    Migraine    "maybe once/wk" (10/15/2014)   Palpitations    Event monitor 2023/2024 benign   Peptic ulcer disease    Polycystic ovarian syndrome    Prediabetes    06/2022 a1c 6.4%-->metformin started   Recurrent UTI     Past Surgical History:  Procedure Laterality Date   BLADDER REPAIR     CESAREAN SECTION  2021   CHOLECYSTECTOMY  2022   CYSTOSCOPY  2022   TUBAL LIGATION Bilateral 2021    Outpatient Medications Prior to Visit   Medication Sig Dispense Refill   acetaminophen (TYLENOL) 325 MG tablet Take 650 mg by mouth every 6 (six) hours as needed for mild pain, moderate pain or headache.      amLODipine (NORVASC) 5 MG tablet Take 1 tablet (5 mg total) by mouth daily. 90 tablet 1   ASPIRIN 81 PO Take by mouth.     cyclobenzaprine (FLEXERIL) 10 MG tablet Take 10 mg by mouth 2 (two) times daily as needed.     meloxicam (MOBIC) 15 MG tablet Take by mouth.     rosuvastatin (CRESTOR) 40 MG tablet Take 1 tablet (40 mg total) by mouth daily. 90 tablet 1   Vitamin D-Vitamin K (VITAMIN K2-VITAMIN D3 PO) Take by mouth daily. 10,000 iu +     metFORMIN (GLUCOPHAGE) 500 MG tablet Take 1 tablet (500 mg total) by mouth daily. 90 tablet 1   Ascorbic Acid (VITAMIN C PO) Take by mouth daily. (Patient not taking: Reported on 08/29/2022)     Ca Carbonate-Mag Hydroxide (ROLAIDS PO) Take by mouth as needed. (Patient not taking: Reported on 08/29/2022)     LORazepam (ATIVAN) 0.5 MG tablet 1 tab po bid as needed 30 tablet 1   mupirocin ointment (BACTROBAN) 2 % Apply 1 Application topically 3 (three) times daily. For 1 week (Patient not taking: Reported on 08/29/2022) 15 g 0   phentermine 37.5 MG capsule Take 1 capsule (37.5 mg total) by mouth every morning. 30 capsule  2   No facility-administered medications prior to visit.    Allergies  Allergen Reactions   Latex Itching, Rash and Hives   Shellfish Allergy Itching and Swelling    Rash / Swelling  Rash / Swelling Rash / Swelling    Review of Systems As per HPI  PE:    08/29/2022   10:54 AM 07/20/2022    9:52 AM 06/21/2022   10:09 AM  Vitals with BMI  Height   5\' 5"   Weight 183 lbs 10 oz 178 lbs 3 oz 191 lbs  BMI  29.65 31.78  Systolic 149 135 098  Diastolic 99 82 85  Pulse 82 85 62  Initial bp 149/99 today Rpt manual 136/86 P 82  Physical Exam  Gen: Alert, well appearing.  Patient is oriented to person, place, time, and situation. AFFECT: pleasant, lucid thought  and speech. No further exam today.  LABS:  Last CBC Lab Results  Component Value Date   WBC 11.5 (H) 06/21/2022   HGB 12.7 06/21/2022   HCT 38.4 06/21/2022   MCV 85.9 06/21/2022   MCH 27.7 07/15/2021   RDW 13.9 06/21/2022   PLT 204.0 06/21/2022   Last metabolic panel Lab Results  Component Value Date   GLUCOSE 114 (H) 06/21/2022   NA 141 06/21/2022   K 4.1 06/21/2022   CL 103 06/21/2022   CO2 30 06/21/2022   BUN 15 06/21/2022   CREATININE 0.83 06/21/2022   GFR 92.97 06/21/2022   CALCIUM 9.0 06/21/2022   PHOS 2.7 10/15/2014   PROT 6.6 06/21/2022   ALBUMIN 4.1 06/21/2022   BILITOT 0.3 06/21/2022   ALKPHOS 81 06/21/2022   AST 15 06/21/2022   ALT 21 06/21/2022   ANIONGAP 8 07/15/2021   Last lipids Lab Results  Component Value Date   CHOL 197 05/14/2022   HDL 36.80 (L) 05/14/2022   LDLDIRECT 135.0 05/14/2022   TRIG 203.0 (H) 05/14/2022   CHOLHDL 5 05/14/2022   Last hemoglobin A1c Lab Results  Component Value Date   HGBA1C 6.4 06/22/2022   Last thyroid functions Lab Results  Component Value Date   TSH 1.43 06/21/2022   IMPRESSION AND PLAN:  #1 GAD, hx panic attacks. Doing great on bid prn use of lorazepam 0.5mg . CSC and UDS today.  #2 Prediabetes+ obesity class I/weight management. No probs since getting on metformin 500 every day. Will increase to 500 bid today.  Next A1c on 09/21/2022 Continue phentermine.  She has made some great strides in therapeutic lifestyle changes but admits it is hard to keep consistent with this. CSC and UDS today.  #3 hypertension and hypercholesterolemia. We will do lipid and c-Met at next follow-up 09/21/22  #4 vitamin D deficiency. She has been on 10,000 IU vitamin D supplement for the last 3 to 4 months after having been diagnosed with very low vitamin D by her prior PCP. Recheck vitamin D level today.  5.  Chronic fatigue. She has initial evaluation for obstructive sleep apnea next week.  An After Visit Summary was  printed and given to the patient.  FOLLOW UP: Return for Keep appointment set for 8-24.  Signed:  Santiago Bumpers, MD           08/29/2022

## 2022-08-30 ENCOUNTER — Encounter: Payer: Self-pay | Admitting: Family Medicine

## 2022-08-30 NOTE — Telephone Encounter (Signed)
Therapy Discontinued on 07/20/2022 by Nicoletta Ba per 07/20/2022 Note.

## 2022-08-31 LAB — DRUG MONITORING PANEL 376104, URINE
Amphetamine: NEGATIVE ng/mL (ref <250)
Amphetamines: NEGATIVE ng/mL (ref <500)
Barbiturates: NEGATIVE ng/mL (ref <300)
Benzodiazepines: NEGATIVE ng/mL (ref <100)
Cocaine Metabolite: NEGATIVE ng/mL (ref <150)
Desmethyltramadol: NEGATIVE ng/mL (ref <100)
Methamphetamine: NEGATIVE ng/mL (ref <250)
Opiates: NEGATIVE ng/mL (ref <100)
Oxycodone: NEGATIVE ng/mL (ref <100)
Tramadol: NEGATIVE ng/mL (ref <100)

## 2022-08-31 LAB — DM TEMPLATE

## 2022-09-05 NOTE — Progress Notes (Signed)
09/06/22- 32 yoF Smoker (1ppd) for sleep evaluation courtesy of Earley Favor, MD with concern of OSA Medical problem list includes  HTN, PCOS, hx UTI/ Pyelonephritis, FEAnemia, hx CVA, Anxiety/Depression, GERD,  Epworth score- 16 Body weight today-183 lbs Mother of 33 yo and 2 yo, not currently working. Always tired despite much caffeine. Fights dozing in evening if she sits, but says sleep is not restfull, with tossing and turning. No complex parasomnias. Aware that she snores.  She thinks she averages about 3 hours of sleep at night. Dog licks her face in bed, which she thinks means she had stopped breathing. Bedtime may be , but may take 1-2 hours for sleep onset. Previous tries with otc sleep meds always left her groggy next day. Denies ENT surgery, heart or lung problems.  Prior to Admission medications   Medication Sig Start Date End Date Taking? Authorizing Provider  acetaminophen (TYLENOL) 325 MG tablet Take 650 mg by mouth every 6 (six) hours as needed for mild pain, moderate pain or headache.    Yes [provider]  amLODipine (NORVASC) 5 MG tablet Take 1 tablet (5 mg total) by mouth daily. 07/20/22  Yes McGowen, Maryjean Morn, MD  ASPIRIN 81 PO Take by mouth.   Yes [provider]  cyclobenzaprine (FLEXERIL) 10 MG tablet Take 10 mg by mouth 2 (two) times daily as needed. 08/25/21  Yes [provider]  LORazepam (ATIVAN) 0.5 MG tablet 1 tab po bid as needed 08/29/22  Yes McGowen, Maryjean Morn, MD  meloxicam (MOBIC) 15 MG tablet Take by mouth. 08/15/21  Yes [provider]  metFORMIN (GLUCOPHAGE) 500 MG tablet Take 1 tablet (500 mg total) by mouth 2 (two) times daily with a meal. 08/29/22  Yes McGowen, Maryjean Morn, MD  phentermine 37.5 MG capsule Take 1 capsule (37.5 mg total) by mouth every morning. 08/29/22  Yes McGowen, Maryjean Morn, MD  rosuvastatin (CRESTOR) 40 MG tablet Take 1 tablet (40 mg total) by mouth daily. 07/20/22  Yes McGowen, Maryjean Morn, MD  Vitamin  D-Vitamin K (VITAMIN K2-VITAMIN D3 PO) Take by mouth daily. 10,000 iu +   Yes [provider]   Past Medical History:  Diagnosis Date   Anxiety and depression    GERD (gastroesophageal reflux disease)    History of CVA (cerebrovascular accident)    L basal ganglia 10/2021  Echo normal.   Hypercholesterolemia    Hypertension    Migraine    "maybe once/wk" (10/15/2014)   Palpitations    Event monitor 2023/2024 benign   Peptic ulcer disease    Polycystic ovarian syndrome    Prediabetes    06/2022 a1c 6.4%-->metformin started   Recurrent UTI    Vitamin D deficiency    Past Surgical History:  Procedure Laterality Date   BLADDER REPAIR     CESAREAN SECTION  2021   CHOLECYSTECTOMY  2022   CYSTOSCOPY  2022   TUBAL LIGATION Bilateral 2021   Family History  Problem Relation Age of Onset   Migraines Mother    Depression Mother    Diabetes Mother    Hyperlipidemia Mother    Hypertension Mother    Mental illness Mother    Alcohol abuse Father    Seizures Brother    Multiple sclerosis Maternal Grandmother    Migraines Maternal Grandmother    Arthritis Maternal Grandmother    Depression Maternal Grandmother    Early death Maternal Grandmother    Hyperlipidemia Maternal Grandmother    Hypertension Maternal Grandmother  Kidney disease Maternal Grandmother    Mental illness Maternal Grandmother    Heart attack Maternal Grandfather    Heart disease Maternal Grandfather    Migraines Maternal Aunt    Depression Daughter    Learning disabilities Daughter    Mental illness Daughter    Asthma Son    Birth defects Son    Learning disabilities Son    Social History   Socioeconomic History   Marital status: Divorced    Spouse name: Not on file   Number of children: Not on file   Years of education: Not on file   Highest education level: Some college, no degree  Occupational History   Not on file  Tobacco Use   Smoking status: Every Day    Current packs/day:  1.00    Average packs/day: 1 pack/day for 2.0 years (2.0 ttl pk-yrs)    Types: Cigarettes   Smokeless tobacco: Never  Substance and Sexual Activity   Alcohol use: Yes    Alcohol/week: 0.0 standard drinks of alcohol    Comment: 10/15/2014 "might drink once/year"   Drug use: Not Currently    Types: Marijuana    Comment: 10/15/2014 "pot maybe once/2 weeks"   Sexual activity: Yes    Partners: Female, Female    Birth control/protection: Surgical  Other Topics Concern   Not on file  Social History Narrative   Single, has 1 son and 1 daughter as of 2024.   Originally from [place], raised in [place].  Lives in [place].   Education: Some college   Occupation: homemaker   No alcohol or drugs.   Tobacco: 30 pack-yr hx, active 2024   Social Determinants of Health   Financial Resource Strain: Patient Declined (08/29/2022)   Overall Financial Resource Strain (CARDIA)    Difficulty of Paying Living Expenses: Patient declined  Food Insecurity: No Food Insecurity (08/29/2022)   Hunger Vital Sign    Worried About Running Out of Food in the Last Year: Never true    Ran Out of Food in the Last Year: Never true  Transportation Needs: Patient Declined (08/29/2022)   PRAPARE - Transportation    Lack of Transportation (Medical): Patient declined    Lack of Transportation (Non-Medical): Patient declined  Physical Activity: Sufficiently Active (08/29/2022)   Exercise Vital Sign    Days of Exercise per Week: 5 days    Minutes of Exercise per Session: 90 min  Stress: Stress Concern Present (08/29/2022)   [employer] of Occupational Health - Occupational Stress Questionnaire    Feeling of Stress : Very much  Social Connections: Unknown (08/29/2022)   Social Connection and Isolation Panel [NHANES]    Frequency of Communication with Friends and Family: Patient declined    Frequency of Social Gatherings with Friends and Family: Patient declined    Attends Religious Services: Patient declined     Database administrator or Organizations: Patient declined    Attends Banker Meetings: Not on file    Marital Status: Divorced  Intimate Partner Violence: Unknown (05/24/2021)   Received from [employer], [facility]   HITS    Physically Hurt: Not on file    Insult or Talk Down To: Not on file    Threaten Physical Harm: Not on file    Scream or Curse: Not on file   ROS-see HPI   + = positive Constitutional:    weight loss, night sweats, fevers, chills, fatigue, lassitude. HEENT:    +headaches, difficulty swallowing, tooth/dental problems, +sore  throat,       sneezing, itching, +ear ache, +nasal congestion, post nasal drip, snoring CV:    chest pain, orthopnea, PND, +swelling in lower extremities, anasarca,                                   dizziness, palpitations Resp:   +shortness of breath with exertion or at rest.                +productive cough,   non-productive cough, coughing up of blood.              change in color of mucus.  wheezing.   Skin:    rash or lesions. GI:  +heartburn, indigestion, abdominal pain, nausea, vomiting, diarrhea,                 change in bowel habits, loss of appetite GU: dysuria, change in color of urine, no urgency or frequency.   flank pain. MS:   joint pain, stiffness, decreased range of motion, back pain. Neuro-     nothing unusual Psych:  change in mood or affect.  +depression or anxiety.   memory loss.  OBJ- Physical Exam General- Alert, Oriented, Affect-appropriate, Distress- none acute Skin- rash-none, lesions- none, excoriation- none Lymphadenopathy- none Head- atraumatic            Eyes- Gross vision intact, PERRLA, conjunctivae and secretions clear            Ears- Hearing, canals-normal            Nose- Clear, no-Septal dev, mucus, polyps, erosion, perforation             Throat- Mallampati III-IV , mucosa clear , drainage- none, tonsils- atrophic, +tongue stud,+teeth Neck- flexible , trachea midline, no stridor ,  thyroid nl, carotid no bruit Chest - symmetrical excursion , unlabored           Heart/CV- RRR , no murmur , no gallop  , no rub, nl s1 s2                           - JVD- none , edema- none, stasis changes- none, varices- none           Lung- clear to P&A, wheeze- none, cough- none , dullness-none, rub- none           Chest wall-  Abd-  Br/ Gen/ Rectal- Not done, not indicated Extrem- cyanosis- none, clubbing, none, atrophy- none, strength- nl Neuro- grossly intact to observation

## 2022-09-06 ENCOUNTER — Ambulatory Visit: Payer: Medicaid Other | Admitting: Internal Medicine

## 2022-09-06 ENCOUNTER — Encounter: Payer: Self-pay | Admitting: Internal Medicine

## 2022-09-06 VITALS — BP 112/78 | HR 79 | Ht 65.0 in | Wt 183.0 lb

## 2022-09-06 DIAGNOSIS — G4700 Insomnia, unspecified: Secondary | ICD-10-CM | POA: Insufficient documentation

## 2022-09-06 DIAGNOSIS — F5101 Primary insomnia: Secondary | ICD-10-CM

## 2022-09-06 DIAGNOSIS — R0683 Snoring: Secondary | ICD-10-CM

## 2022-09-06 NOTE — Assessment & Plan Note (Signed)
Significant difficulty initiating and maintaining sleep, with short sleep time.  We will reassess after sleep study done.

## 2022-09-06 NOTE — Assessment & Plan Note (Signed)
Suspect OSA. Appropriate discussion done.  Plan- schedule sleep study

## 2022-09-06 NOTE — Patient Instructions (Signed)
Order- schedule home sleep test  dx snoring  Please call us about 2 weeks after your sleep tet for results and recommendations.

## 2022-09-19 DIAGNOSIS — M62838 Other muscle spasm: Secondary | ICD-10-CM | POA: Diagnosis not present

## 2022-09-19 DIAGNOSIS — S29012A Strain of muscle and tendon of back wall of thorax, initial encounter: Secondary | ICD-10-CM | POA: Diagnosis not present

## 2022-09-19 DIAGNOSIS — M546 Pain in thoracic spine: Secondary | ICD-10-CM | POA: Diagnosis not present

## 2022-09-21 ENCOUNTER — Encounter: Payer: Self-pay | Admitting: Family Medicine

## 2022-09-21 ENCOUNTER — Ambulatory Visit: Payer: Medicaid Other | Admitting: Family Medicine

## 2022-09-21 VITALS — BP 149/96 | HR 84 | Temp 98.8°F | Wt 184.6 lb

## 2022-09-21 DIAGNOSIS — R7303 Prediabetes: Secondary | ICD-10-CM

## 2022-09-21 DIAGNOSIS — I10 Essential (primary) hypertension: Secondary | ICD-10-CM

## 2022-09-21 DIAGNOSIS — M545 Low back pain, unspecified: Secondary | ICD-10-CM | POA: Diagnosis not present

## 2022-09-21 DIAGNOSIS — M546 Pain in thoracic spine: Secondary | ICD-10-CM | POA: Diagnosis not present

## 2022-09-21 LAB — POCT GLYCOSYLATED HEMOGLOBIN (HGB A1C)
HbA1c POC (<> result, manual entry): 6.1 % (ref 4.0–5.6)
HbA1c, POC (controlled diabetic range): 6.1 % (ref 0.0–7.0)
HbA1c, POC (prediabetic range): 6.1 % (ref 5.7–6.4)
Hemoglobin A1C: 6.1 % — AB (ref 4.0–5.6)

## 2022-09-21 MED ORDER — KETOROLAC TROMETHAMINE 60 MG/2ML IM SOLN
60.0000 mg | Freq: Once | INTRAMUSCULAR | Status: AC
Start: 2022-09-21 — End: 2022-09-21
  Administered 2022-09-21: 60 mg via INTRAMUSCULAR

## 2022-09-21 MED ORDER — AMLODIPINE BESYLATE 10 MG PO TABS
10.0000 mg | ORAL_TABLET | Freq: Every day | ORAL | 0 refills | Status: DC
Start: 2022-09-21 — End: 2022-10-04

## 2022-09-21 NOTE — Progress Notes (Addendum)
OFFICE VISIT  09/21/22  CC:  Chief Complaint  Patient presents with   Follow-up    3 month rci. Went to [place] urgent care on 7/31 for back pain and was given some meds but her back pain is not better.    Patient is a 32 y.o. female who presents for follow-up prediabetes, hypertension, and hypercholesterolemia. On 08/29/2022 I increased her metformin to 500 twice daily.  INTERIM HX:  She got sleep evaluation 09/06/2022 and there is plan for sleep study.  Back pain in L midback region onset approx 1 wk ago. Went to [facility] 09/19/22, note reviewed today. Dx L thoracic back pain/strain, was given decadron inj, rx'd tramadol, diclofenac,zanaflex.  The pain is the same.  She describes a trigger point.  ROS as above, plus--> no fevers, no CP, no SOB, no wheezing, no cough, no dizziness, no HAs, no rashes, no melena/hematochezia.  No polyuria or polydipsia.  No focal weakness, paresthesias, or tremors.  No acute vision or hearing abnormalities.  No dysuria or unusual/new urinary urgency or frequency.  No recent changes in lower legs. No n/v/d or abd pain.  No palpitations.    Past Medical History:  Diagnosis Date   Anxiety and depression    GERD (gastroesophageal reflux disease)    History of CVA (cerebrovascular accident)    L basal ganglia 10/2021  Echo normal.   Hypercholesterolemia    Hypertension    Migraine    "maybe once/wk" (10/15/2014)   Palpitations    Event monitor 2023/2024 benign   Peptic ulcer disease    Polycystic ovarian syndrome    Prediabetes    06/2022 a1c 6.4%-->metformin started   Recurrent UTI    Vitamin D deficiency     Past Surgical History:  Procedure Laterality Date   BLADDER REPAIR     CESAREAN SECTION  2021   CHOLECYSTECTOMY  2022   CYSTOSCOPY  2022   TUBAL LIGATION Bilateral 2021    Outpatient Medications Prior to Visit  Medication Sig Dispense Refill   acetaminophen (TYLENOL) 325 MG tablet Take 650 mg by mouth every 6 (six) hours as needed for mild  pain, moderate pain or headache.      ASPIRIN 81 PO Take by mouth.     cyclobenzaprine (FLEXERIL) 10 MG tablet Take 10 mg by mouth 2 (two) times daily as needed.     LORazepam (ATIVAN) 0.5 MG tablet 1 tab po bid as needed 30 tablet 1   metFORMIN (GLUCOPHAGE) 500 MG tablet Take 1 tablet (500 mg total) by mouth 2 (two) times daily with a meal. 180 tablet 1   phentermine 37.5 MG capsule Take 1 capsule (37.5 mg total) by mouth every morning. 90 capsule 1   rosuvastatin (CRESTOR) 40 MG tablet Take 1 tablet (40 mg total) by mouth daily. 90 tablet 1   traMADol (ULTRAM) 50 MG tablet Take by mouth.     Vitamin D-Vitamin K (VITAMIN K2-VITAMIN D3 PO) Take by mouth daily. 10,000 iu +     amLODipine (NORVASC) 5 MG tablet Take 1 tablet (5 mg total) by mouth daily. 90 tablet 1   dexamethasone (DECADRON) 4 MG tablet Take 3 tablets once daily today then take 2 tablets once daily x 3 additional days.  Take on a full stomach.     meloxicam (MOBIC) 15 MG tablet Take by mouth.     tiZANidine (ZANAFLEX) 4 MG tablet Take by mouth.     No facility-administered medications prior to visit.  Allergies  Allergen Reactions   Latex Itching, Rash and Hives   Shellfish Allergy Itching and Swelling    Rash / Swelling  Rash / Swelling Rash / Swelling    Review of Systems As per HPI  PE:    09/21/2022    9:51 AM 09/06/2022    1:30 PM 08/29/2022   10:54 AM  Vitals with BMI  Height  5\' 5"    Weight 184 lbs 10 oz 183 lbs 183 lbs 10 oz  BMI 30.72 30.45   Systolic 149 112 161  Diastolic 96 78 99  Pulse 84 79 82   Initial bp today 149/96 P84  Physical Exam  Gen: Alert, well appearing.  Patient is oriented to person, place, time, and situation. Focal tenderness to palpation in the left scapular region. Range of motion of C-spine, T-spine, and L-spine all normal. No bruising or rash.  LABS:  Last CBC Lab Results  Component Value Date   WBC 11.5 (H) 06/21/2022   HGB 12.7 06/21/2022   HCT 38.4  06/21/2022   MCV 85.9 06/21/2022   MCH 27.7 07/15/2021   RDW 13.9 06/21/2022   PLT 204.0 06/21/2022   Last metabolic panel Lab Results  Component Value Date   GLUCOSE 114 (H) 06/21/2022   NA 141 06/21/2022   K 4.1 06/21/2022   CL 103 06/21/2022   CO2 30 06/21/2022   BUN 15 06/21/2022   CREATININE 0.83 06/21/2022   GFR 92.97 06/21/2022   CALCIUM 9.0 06/21/2022   PHOS 2.7 10/15/2014   PROT 6.6 06/21/2022   ALBUMIN 4.1 06/21/2022   BILITOT 0.3 06/21/2022   ALKPHOS 81 06/21/2022   AST 15 06/21/2022   ALT 21 06/21/2022   ANIONGAP 8 07/15/2021   Last lipids Lab Results  Component Value Date   CHOL 197 05/14/2022   HDL 36.80 (L) 05/14/2022   LDLDIRECT 135.0 05/14/2022   TRIG 203.0 (H) 05/14/2022   CHOLHDL 5 05/14/2022   Last hemoglobin A1c Lab Results  Component Value Date   HGBA1C 6.1 (A) 09/21/2022   HGBA1C 6.1 09/21/2022   HGBA1C 6.1 09/21/2022   HGBA1C 6.1 09/21/2022   IMPRESSION AND PLAN:  #1 hypertension, poor control. Inc amlod to 10 every day.  #2 prediabetes. POC Hba1c today is 6.1%, improved from 6.4% 3 mo ago. Continue metformin 500 mg twice a day.  #3 hypercholesterolemia.  She is on rosuvastatin 40 mg a day. Most recent LDL about 6 months ago was 135. We we will recheck lipids at next follow-up visit.  4.  Trigger point left scapular region. Toradol 60mg  IM today. Return for trigger pt inj if not improving.  She is considering seeing chiropractor.  Lipids and be met at next follow-up visit.  An After Visit Summary was printed and given to the patient.  FOLLOW UP: Return in about 2 weeks (around 10/05/2022) for f/u HTN and trigger point.  Signed:  Santiago Bumpers, MD           09/27/2022

## 2022-09-21 NOTE — Patient Instructions (Signed)
You can stop taking decadron and zanaflex.  OK to continue ibuprofen (advil/motrin) 600 mg twice a day as needed but wait until tomorrow before taking it.

## 2022-09-26 ENCOUNTER — Telehealth: Payer: Self-pay | Admitting: Internal Medicine

## 2022-09-26 DIAGNOSIS — R0683 Snoring: Secondary | ICD-10-CM

## 2022-09-26 NOTE — Telephone Encounter (Signed)
There wasn't an order placed on 09/06/22. Would someone place the order?

## 2022-09-26 NOTE — Telephone Encounter (Signed)
Per office note from 09/06/2022- Order-schedule home sleep test   dx snoring   I have placed the order.

## 2022-10-04 NOTE — Patient Instructions (Signed)

## 2022-10-05 ENCOUNTER — Ambulatory Visit: Payer: Medicaid Other | Admitting: Family Medicine

## 2022-10-05 ENCOUNTER — Encounter: Payer: Self-pay | Admitting: Family Medicine

## 2022-10-05 VITALS — BP 134/84 | HR 97 | Temp 99.2°F | Wt 179.2 lb

## 2022-10-05 DIAGNOSIS — I10 Essential (primary) hypertension: Secondary | ICD-10-CM

## 2022-10-05 DIAGNOSIS — M546 Pain in thoracic spine: Secondary | ICD-10-CM | POA: Diagnosis not present

## 2022-10-05 DIAGNOSIS — E7800 Pure hypercholesterolemia, unspecified: Secondary | ICD-10-CM

## 2022-10-05 DIAGNOSIS — N3000 Acute cystitis without hematuria: Secondary | ICD-10-CM

## 2022-10-05 DIAGNOSIS — R300 Dysuria: Secondary | ICD-10-CM | POA: Diagnosis not present

## 2022-10-05 LAB — BASIC METABOLIC PANEL
BUN: 16 mg/dL (ref 6–23)
CO2: 32 mEq/L (ref 19–32)
Calcium: 10 mg/dL (ref 8.4–10.5)
Chloride: 99 mEq/L (ref 96–112)
Creatinine, Ser: 0.82 mg/dL (ref 0.40–1.20)
GFR: 94.14 mL/min (ref >60.00)
Glucose, Bld: 109 mg/dL — ABNORMAL HIGH (ref 70–99)
Potassium: 3.8 mEq/L (ref 3.5–5.1)
Sodium: 138 mEq/L (ref 135–145)

## 2022-10-05 LAB — LIPID PANEL
Cholesterol: 140 mg/dL (ref 0–200)
HDL: 49.7 mg/dL (ref >39.00)
LDL Cholesterol: 62 mg/dL (ref 0–99)
NonHDL: 90.01
Total CHOL/HDL Ratio: 3
Triglycerides: 141 mg/dL (ref 0.0–149.0)
VLDL: 28.2 mg/dL (ref 0.0–40.0)

## 2022-10-05 MED ORDER — AMLODIPINE BESYLATE 10 MG PO TABS: 10.0000 mg | ORAL_TABLET | Freq: Every day | ORAL | 1 refills | Status: DC

## 2022-10-05 MED ORDER — SULFAMETHOXAZOLE-TRIMETHOPRIM 800-160 MG PO TABS: 1.0000 | ORAL_TABLET | Freq: Two times a day (BID) | ORAL | 0 refills | Status: DC

## 2022-10-05 NOTE — Progress Notes (Signed)
OFFICE VISIT  10/05/2022  CC:  Chief Complaint  Patient presents with   Hypertension    2 week follow up on Bp. She has also been having urinary urgency and burning.    Patient is a 32 y.o. female who presents for 2-week follow-up trigger point and hypertension. A/P as of last visit: "1 hypertension, poor control. Inc amlod to 10 every day.   #2 prediabetes. POC Hba1c today is 6.1%, improved from 6.4% 3 mo ago. Continue metformin 500 mg twice a day.   #3 hypercholesterolemia.  She is on rosuvastatin 40 mg a day. Most recent LDL about 6 months ago was 135. We we will recheck lipids at next follow-up visit.   4.  Trigger point left scapular region. Toradol 60mg  IM today. Return for trigger pt inj if not improving.  She is considering seeing chiropractor.   Lipids and bemet at next follow-up visit."  INTERIM HX: Her back pain is improved, although not resolved.  She is looking for a Land. No home blood pressure monitoring but she says she got her blood pressure cuff out. She has been taking 2 of her 5 mg amlodipine every day.  She has several days of burning with urination, urinary urgency and frequency, and cloudy urine. She has a history of recurrent urinary tract infections.   Past Medical History:  Diagnosis Date   Anxiety and depression    GERD (gastroesophageal reflux disease)    History of CVA (cerebrovascular accident)    L basal ganglia 10/2021  Echo normal.   Hypercholesterolemia    Hypertension    Migraine    "maybe once/wk" (10/15/2014)   Palpitations    Event monitor 2023/2024 benign   Peptic ulcer disease    Polycystic ovarian syndrome    Prediabetes    06/2022 a1c 6.4%-->metformin started   Recurrent UTI    Vitamin D deficiency     Past Surgical History:  Procedure Laterality Date   BLADDER REPAIR     CESAREAN SECTION  2021   CHOLECYSTECTOMY  2022   CYSTOSCOPY  2022   TUBAL LIGATION Bilateral 2021    Outpatient Medications Prior to  Visit  Medication Sig Dispense Refill   acetaminophen (TYLENOL) 325 MG tablet Take 650 mg by mouth every 6 (six) hours as needed for mild pain, moderate pain or headache.      ASPIRIN 81 PO Take by mouth.     cyclobenzaprine (FLEXERIL) 10 MG tablet Take 10 mg by mouth 2 (two) times daily as needed.     LORazepam (ATIVAN) 0.5 MG tablet 1 tab po bid as needed 30 tablet 1   metFORMIN (GLUCOPHAGE) 500 MG tablet Take 1 tablet (500 mg total) by mouth 2 (two) times daily with a meal. 180 tablet 1   phentermine 37.5 MG capsule Take 1 capsule (37.5 mg total) by mouth every morning. 90 capsule 1   rosuvastatin (CRESTOR) 40 MG tablet Take 1 tablet (40 mg total) by mouth daily. 90 tablet 1   Vitamin D-Vitamin K (VITAMIN K2-VITAMIN D3 PO) Take by mouth daily. 10,000 iu +     amLODipine (NORVASC) 10 MG tablet Take 1 tablet (10 mg total) by mouth daily. 30 tablet 0   No facility-administered medications prior to visit.    Allergies  Allergen Reactions   Latex Itching, Rash and Hives   Shellfish Allergy Itching and Swelling    Rash / Swelling  Rash / Swelling Rash / Swelling    Review of Systems As  per HPI  PE:    10/05/2022   10:08 AM 10/05/2022    9:49 AM 09/21/2022    9:51 AM  Vitals with BMI  Weight  179 lbs 3 oz 184 lbs 10 oz  BMI   30.72  Systolic 134 137 409  Diastolic 84 94 96  Pulse  97 84   Manual bp today 134/84  Physical Exam  Gen: Alert, well appearing.  Patient is oriented to person, place, time, and situation. AFFECT: pleasant, lucid thought and speech. No further exam today  LABS:  Last CBC Lab Results  Component Value Date   WBC 11.5 (H) 06/21/2022   HGB 12.7 06/21/2022   HCT 38.4 06/21/2022   MCV 85.9 06/21/2022   MCH 27.7 07/15/2021   RDW 13.9 06/21/2022   PLT 204.0 06/21/2022   Last metabolic panel Lab Results  Component Value Date   GLUCOSE 114 (H) 06/21/2022   NA 141 06/21/2022   K 4.1 06/21/2022   CL 103 06/21/2022   CO2 30 06/21/2022    BUN 15 06/21/2022   CREATININE 0.83 06/21/2022   GFR 92.97 06/21/2022   CALCIUM 9.0 06/21/2022   PHOS 2.7 10/15/2014   PROT 6.6 06/21/2022   ALBUMIN 4.1 06/21/2022   BILITOT 0.3 06/21/2022   ALKPHOS 81 06/21/2022   AST 15 06/21/2022   ALT 21 06/21/2022   ANIONGAP 8 07/15/2021   Last lipids Lab Results  Component Value Date   CHOL 197 05/14/2022   HDL 36.80 (L) 05/14/2022   LDLDIRECT 135.0 05/14/2022   TRIG 203.0 (H) 05/14/2022   CHOLHDL 5 05/14/2022   Last hemoglobin A1c Lab Results  Component Value Date   HGBA1C 6.1 (A) 09/21/2022   HGBA1C 6.1 09/21/2022   HGBA1C 6.1 09/21/2022   HGBA1C 6.1 09/21/2022   Last thyroid functions Lab Results  Component Value Date   TSH 1.43 06/21/2022   IMPRESSION AND PLAN:  #1 trigger point in left scapular area.  Significantly improved.  No new treatment today.  2.  Uncontrolled hypertension. Improved with increase of amlodipine to 10 mg daily. Encouraged her to check her blood pressure 1-2 times a week at home and if not consistently near 130/80 then call return. Electrolytes and creatinine today.  3.  Hypercholesterolemia.  She is on rosuvastatin 40 mg a day. Most recent LDL about 6 months ago was 135. Lipid panel today.  Hepatic panel normal 3 months ago.  #4 UTI.   She was unable to produce a urine specimen today. Bactrim double strength 1 twice daily x 5 days.  An After Visit Summary was printed and given to the patient.  FOLLOW UP: Return in about 5 months (around 03/07/2023) for routine chronic illness f/u. Next cpe 06/2023 Signed:  Santiago Bumpers, MD           10/05/2022

## 2022-10-11 DIAGNOSIS — M10031 Idiopathic gout, right wrist: Secondary | ICD-10-CM | POA: Diagnosis not present

## 2022-10-17 NOTE — Progress Notes (Deleted)
Virtual Visit via Video Note  Consent was obtained for video visit:  Yes.   Answered questions that patient had about telehealth interaction:  Yes.   I discussed the limitations, risks, security and privacy concerns of performing an evaluation and management service by telemedicine. I also discussed with the patient that there may be a patient responsible charge related to this service. The patient expressed understanding and agreed to proceed.  Pt location: Home Physician Location: office Name of referring provider:  [facility] I connected with Betty Dean at patients initiation/request on 10/18/2022 at 11:30 AM EDT by video enabled telemedicine application and verified that I am speaking with the correct person using two identifiers. Pt MRN:  [id number] Pt DOB:  07/18/1990 Video Participants:  Betty Dean   Assessment/Plan:   Left basal ganglia infarct - likely small vessel given comorbidities, but due to age, would still check hypercoagulable panel. Migraine with and without aura, without status migrainosus, not intractable. Hypertension Tobacco use disorder Hyperlipidemia   Migraine prevention:  Start propranolol titrating to 40mg  twice daily Migraine rescue:  Ubrelvy 100mg .  Will look into denial and appeal.  She has failed over the counter analgesics and triptans are contraindicated due to stroke.  In addition, Bernita Raisin has been very effective. Secondary stroke prevention as managed by PCP: Restart ASA 81mg  daily Statin.  LDL goal less than 70.  Will prescribe rosuvastatin 10mg  daily with refills until she can establish care.  Check lipid panel and LFTs. Normotensive blood pressure Hgb A1c goal less than 7 Smoking cessation Mediterranean diet Routine excercise Follow up 6-7 months.       Subjective:  Betty Dean is a 32 year old right-handed female with HTN, tobacco abuse, depression, anxiety, migraine, PCOS and history of stomach ulcer who  follows up for stroke and migraine.  UPDATE: Started propranolol last visit but discontinued because it was causing increased headaches.  Bernita Raisin helped but has been denied by her insurance. Migraines have been occurring once a week but lasting 3-4 days (would abort in 15-30 minutes with Bernita Raisin).  Current migraine rescue:  Tylenol   Current migraine preventative:  none Current muscle relaxant:  Flexeril Other medications:  ASA 81mg  daily, rosuvastatin 40mg , amlodipine, metformin   HISTORY: On 11/11/2021, she developed word-finding difficulty, could not write her name and had difficulty with simple tasks such as typing on her phone.  Also noticed some right arm and hand weakness.  Some difficulty with coordination of her right foot and hand.  No associated headache, dizziness, visual disturbance, unilateral numbness or weakness.  She has history of classic migraines which are manageable.  Denies recent head trauma.  For the previous month, she had been experiencing dizziness and palpitations.  She was evaluated by cardiology and underwent a 4 week cardiac event monitor which revealed 4 PVCs with 1 morphologies but no a fib, AV block or pauses or other significant arrhythmias.  She went to [facility] where she was admitted.  Blood pressure in the ED was 205/112.  CT perfusion was negative but CT head demonstrated decreased density in the left basal ganglia.  CTA of head and neck revealed  no LVO, hemodynamically significant stenosis or dissection.  MRI of brain confirmed a moderate lacunar infarct within the left basal ganglia.  Echo with bubble study showed EF 55-60% with no PFO.  She was discharged on dual antiplatelet therapy (ASA 81mg  and Plavix) followed by ASA alone.  She was also started on Crestor.  Hypercoagulable panel on 12/11/2021 was normal.     Overall, symptoms are much improved.  Still feel slightly weak in right hand and sometimes may have trouble with words.  Has cut down on smoking  from 1 1/2 ppd to 5-6 cigarettes daily.   She also has migraines.  Migraines are severe bilateral retro-orbital pounding pain with nausea, vomiting, photophobia, phonophobia and sometimes with blurred vision or sees stars.  Usually lasts 1-2 days and occurs once a month.    Past migraine rescue:  Fioricet, rizatriptan (Triptans contraindicated) Past migraine prevention:  topiramate, propranolol   CT PERFUSION W:  No evidence of ischemia in the sampled regions of the brain CT HEAD WO:  1. No acute cortical stroke. 2. Small region of decreased density in the left basal ganglia and extending into the left periventricular white matter. This may represent lacune or can be seen with demyelinating process. MRI BRAIN WO:  Moderate acute lacunar infarct left basal ganglia to corona radiata. Otherwise, relatively normal appearance of the brain. CTA HEAD & NECK:  No evidence of dissection, occlusion, aneurysm, or hemodynamically significant stenosis of the arteries in the head/neck. ECHO WITH BUBBLE STUDY:  1. Left ventricle: The cavity size is normal. Systolic function is normal. The estimated ejection fraction is 55-60%. Wall motion is normal; there are no regional wall motion abnormalities. Grade I diastolic dysfunction. 2. Right ventricle: The cavity size is normal. Systolic function is normal. 3. No significant valve disease is identified. 4. Atrial septum: No defect or patent foramen ovale is identified. Negative bubble study.  30 DAY CARDIAC EVENT MONITOR:  The patient was monitored for a total of 27d 20h, underlying rhythm is Sinus. The minimum heart rate was 46 bpm; the maximum 143 bpm; the average 76 bpm. No A-fib, AV block, pauses, VT or VF identified. There were a total of 4 PVCs with 1 morphologies and 0 couplets. Overall PVC Burden at < 0.01 % There were a total of 0 Other Beats. There were 0 total number of paced beats. There were a total of 2070 PSVCs with 1 morphologies and 0 couplets. Overall PSVC  Burden at 0.07 % There is a total of 6 patient events: Dizziness/near syncope associated with normal sinus rhythm, rapid/fast heartbeat associated with mild sinus tachycardia, palpitations/skipping associated with mild sinus tachycardia. Benign-appearing cardiac event monitor. LABS:  Lipid panel with chol 191, TG 653, HDL 28 and direct-LDL 118;   Past Medical History: Past Medical History:  Diagnosis Date   Anxiety and depression    GERD (gastroesophageal reflux disease)    History of CVA (cerebrovascular accident)    L basal ganglia 10/2021  Echo normal.   Hypercholesterolemia    Hypertension    Migraine    "maybe once/wk" (10/15/2014)   Palpitations    Event monitor 2023/2024 benign   Peptic ulcer disease    Polycystic ovarian syndrome    Prediabetes    06/2022 a1c 6.4%-->metformin started   Recurrent UTI    Vitamin D deficiency     Medications: Outpatient Encounter Medications as of 10/18/2022  Medication Sig   acetaminophen (TYLENOL) 325 MG tablet Take 650 mg by mouth every 6 (six) hours as needed for mild pain, moderate pain or headache.    amLODipine (NORVASC) 10 MG tablet Take 1 tablet (10 mg total) by mouth daily.   ASPIRIN 81 PO Take by mouth.   cyclobenzaprine (FLEXERIL) 10 MG tablet Take 10 mg by mouth 2 (two) times daily as needed.   LORazepam (  ATIVAN) 0.5 MG tablet 1 tab po bid as needed   metFORMIN (GLUCOPHAGE) 500 MG tablet Take 1 tablet (500 mg total) by mouth 2 (two) times daily with a meal.   phentermine 37.5 MG capsule Take 1 capsule (37.5 mg total) by mouth every morning.   rosuvastatin (CRESTOR) 40 MG tablet Take 1 tablet (40 mg total) by mouth daily.   sulfamethoxazole-trimethoprim (BACTRIM DS) 800-160 MG tablet Take 1 tablet by mouth 2 (two) times daily.   Vitamin D-Vitamin K (VITAMIN K2-VITAMIN D3 PO) Take by mouth daily. 10,000 iu +   No facility-administered encounter medications on file as of 10/18/2022.    Allergies: Allergies  Allergen  Reactions   Latex Itching, Rash and Hives   Shellfish Allergy Itching and Swelling    Rash / Swelling  Rash / Swelling Rash / Swelling    Family History: Family History  Problem Relation Age of Onset   Migraines Mother    Depression Mother    Diabetes Mother    Hyperlipidemia Mother    Hypertension Mother    Mental illness Mother    Alcohol abuse Father    Seizures Brother    Multiple sclerosis Maternal Grandmother    Migraines Maternal Grandmother    Arthritis Maternal Grandmother    Depression Maternal Grandmother    Early death Maternal Grandmother    Hyperlipidemia Maternal Grandmother    Hypertension Maternal Grandmother    Kidney disease Maternal Grandmother    Mental illness Maternal Grandmother    Heart attack Maternal Grandfather    Heart disease Maternal Grandfather    Migraines Maternal Aunt    Depression Daughter    Learning disabilities Daughter    Mental illness Daughter    Asthma Son    Birth defects Son    Learning disabilities Son     Observations/Objective:   No acute distress.  Alert and oriented.  Speech fluent and not dysarthric.  Language intact.     Follow Up Instructions:    -I discussed the assessment and treatment plan with the patient. The patient was provided an opportunity to ask questions and all were answered. The patient agreed with the plan and demonstrated an understanding of the instructions.   The patient was advised to call back or seek an in-person evaluation if the symptoms worsen or if the condition fails to improve as anticipated.   Cira Servant, DO

## 2022-10-18 ENCOUNTER — Telehealth (INDEPENDENT_AMBULATORY_CARE_PROVIDER_SITE_OTHER): Payer: Medicaid Other | Admitting: Neurology

## 2022-10-18 ENCOUNTER — Other Ambulatory Visit (HOSPITAL_COMMUNITY): Payer: Self-pay

## 2022-10-18 ENCOUNTER — Telehealth: Payer: Self-pay | Admitting: Pharmacy Technician

## 2022-10-18 DIAGNOSIS — I6322 Cerebral infarction due to unspecified occlusion or stenosis of basilar arteries: Secondary | ICD-10-CM

## 2022-10-18 DIAGNOSIS — F17200 Nicotine dependence, unspecified, uncomplicated: Secondary | ICD-10-CM

## 2022-10-18 DIAGNOSIS — E785 Hyperlipidemia, unspecified: Secondary | ICD-10-CM | POA: Diagnosis not present

## 2022-10-18 DIAGNOSIS — F17210 Nicotine dependence, cigarettes, uncomplicated: Secondary | ICD-10-CM

## 2022-10-18 DIAGNOSIS — I10 Essential (primary) hypertension: Secondary | ICD-10-CM

## 2022-10-18 DIAGNOSIS — G43009 Migraine without aura, not intractable, without status migrainosus: Secondary | ICD-10-CM

## 2022-10-18 DIAGNOSIS — I6381 Other cerebral infarction due to occlusion or stenosis of small artery: Secondary | ICD-10-CM

## 2022-10-18 MED ORDER — UBRELVY 100 MG PO TABS: 1.0000 | ORAL_TABLET | ORAL | 11 refills | Status: DC | PRN

## 2022-10-18 MED ORDER — AJOVY 225 MG/1.5ML ~~LOC~~ SOAJ: 225.0000 mg | SUBCUTANEOUS | 11 refills | Status: DC

## 2022-10-18 NOTE — Telephone Encounter (Signed)
Pharmacy Patient Advocate Encounter   Received notification from CoverMyMeds that prior authorization for AJOVY 225MG  is required/requested.   Insurance verification completed.   The patient is insured through [facility] .   Per test claim: PA required; PA submitted to [employer] via CoverMyMeds Key/confirmation #/EOC [id number] Status is pending

## 2022-10-18 NOTE — Telephone Encounter (Signed)
Pharmacy Patient Advocate Encounter   Received notification from CoverMyMeds that prior authorization for UBRELVY 100MG  is required/requested.   Insurance verification completed.   The patient is insured through [facility] .   Per test claim: PA required; PA submitted to [facility] Medicaid via CoverMyMeds Key/confirmation #/EOC [id number] Status is pending

## 2022-10-18 NOTE — Patient Instructions (Signed)
Plan to start Ajovy injection every 28 days Plan to start Ubrelvy as needed for migraine attacks. Follow up 6 to 7 months.

## 2022-10-19 ENCOUNTER — Other Ambulatory Visit (HOSPITAL_COMMUNITY): Payer: Self-pay

## 2022-10-19 NOTE — Telephone Encounter (Signed)
Pharmacy Patient Advocate Encounter  Received notification from [facility] that Prior Authorization for AJOVY (fremanezumab-vfrm) injection 225MG /1.5ML auto-injectors has been APPROVED from 10-18-2022 to 01-18-2023. Ran test claim, Copay is $filled 10-18-2022 unable to show co-pay. Quantity approved 4.28mL per 90 days. This test claim was processed through [facility]- copay amounts may vary at other pharmacies due to pharmacy/plan contracts, or as the patient moves through the different stages of their insurance plan.   PA #/Case ID/Reference #: [id number]

## 2022-10-19 NOTE — Telephone Encounter (Signed)
Pharmacy Patient Advocate Encounter  Received notification from [facility] that Prior Authorization for Ubrelvy 100MG  tablets has been DENIED.  Full denial letter will be uploaded to the media tab. See denial reason below.  Here are the policy requirements your request did not meet: Your doctor must tell us the following:  You didn't have a migraine on more than 15 days per month in the last 6 months You will not be taking this drug with a strong CYP3A4 inhibitor (a type of drug that could increase the amount of the drug asked for in your body to an unsafe level. As you doctor if you take one of these drugs) You do not have end-stage kidney disease (This is when your creatinine clearance (CrCl) less than 5 mL/min, this is a measurement which tells your doctor how well your kidneys are working.  PA #/Case ID/Reference #: [id number]  Please be advised we currently do not have a Pharmacist to review denials, therefore you will need to process appeals accordingly as needed. Thanks for your support at this time. Contact for appeals are as follows: Phone: [phone], Fax: [phone]  Last day to appeal is 12-17-2022

## 2022-10-23 NOTE — Telephone Encounter (Signed)
Patient called stating that she received a denial letter from insurance about Betty Dean

## 2022-10-23 NOTE — Progress Notes (Signed)
Virtual Visit via Video Note  Consent was obtained for video visit:  Yes.   Answered questions that patient had about telehealth interaction:  Yes.   I discussed the limitations, risks, security and privacy concerns of performing an evaluation and management service by telemedicine. I also discussed with the patient that there may be a patient responsible charge related to this service. The patient expressed understanding and agreed to proceed.  Pt location: Home Physician Location: office Name of referring provider:  [facility] I connected with Betty Dean at patients initiation/request on 10/18/2022 at 11:30 AM EDT by video enabled telemedicine application and verified that I am speaking with the correct person using two identifiers. Pt MRN:  [id number] Pt DOB:  07/18/1990 Video Participants:  Betty Dean   Assessment/Plan:   Left basal ganglia infarct - likely small vessel given comorbidities, but due to age, would still check hypercoagulable panel. Migraine with and without aura, without status migrainosus, not intractable. Hypertension Tobacco use disorder Hyperlipidemia   Migraine prevention:  Start propranolol titrating to 40mg  twice daily Migraine rescue:  Ubrelvy 100mg .  Will look into denial and appeal.  She has failed over the counter analgesics and triptans are contraindicated due to stroke.  In addition, Bernita Raisin has been very effective. Secondary stroke prevention as managed by PCP: Restart ASA 81mg  daily Statin.  LDL goal less than 70.  Will prescribe rosuvastatin 10mg  daily with refills until she can establish care.  Check lipid panel and LFTs. Normotensive blood pressure Hgb A1c goal less than 7 Smoking cessation Mediterranean diet Routine excercise Follow up 6-7 months.       Subjective:  Betty Dean is a 32 year old right-handed female with HTN, tobacco abuse, depression, anxiety, migraine, PCOS and history of stomach ulcer who  follows up for stroke and migraine.  UPDATE: Started propranolol last visit but discontinued because it was causing increased headaches.  Bernita Raisin helped but has been denied by her insurance. Migraines have been occurring once a week but lasting 3-4 days (would abort in 15-30 minutes with Bernita Raisin).  Current migraine rescue:  Tylenol   Current migraine preventative:  none Current muscle relaxant:  Flexeril Other medications:  ASA 81mg  daily, rosuvastatin 40mg , amlodipine, metformin   HISTORY: On 11/11/2021, she developed word-finding difficulty, could not write her name and had difficulty with simple tasks such as typing on her phone.  Also noticed some right arm and hand weakness.  Some difficulty with coordination of her right foot and hand.  No associated headache, dizziness, visual disturbance, unilateral numbness or weakness.  She has history of classic migraines which are manageable.  Denies recent head trauma.  For the previous month, she had been experiencing dizziness and palpitations.  She was evaluated by cardiology and underwent a 4 week cardiac event monitor which revealed 4 PVCs with 1 morphologies but no a fib, AV block or pauses or other significant arrhythmias.  She went to [facility] where she was admitted.  Blood pressure in the ED was 205/112.  CT perfusion was negative but CT head demonstrated decreased density in the left basal ganglia.  CTA of head and neck revealed  no LVO, hemodynamically significant stenosis or dissection.  MRI of brain confirmed a moderate lacunar infarct within the left basal ganglia.  Echo with bubble study showed EF 55-60% with no PFO.  She was discharged on dual antiplatelet therapy (ASA 81mg  and Plavix) followed by ASA alone.  She was also started on Crestor.  Hypercoagulable panel on 12/11/2021 was normal.     Overall, symptoms are much improved.  Still feel slightly weak in right hand and sometimes may have trouble with words.  Has cut down on smoking  from 1 1/2 ppd to 5-6 cigarettes daily.   She also has migraines.  Migraines are severe bilateral retro-orbital pounding pain with nausea, vomiting, photophobia, phonophobia and sometimes with blurred vision or sees stars.  Usually lasts 1-2 days and occurs once a month.    Past migraine rescue:  Fioricet, rizatriptan (Triptans contraindicated) Past migraine prevention:  topiramate, propranolol   CT PERFUSION W:  No evidence of ischemia in the sampled regions of the brain CT HEAD WO:  1. No acute cortical stroke. 2. Small region of decreased density in the left basal ganglia and extending into the left periventricular white matter. This may represent lacune or can be seen with demyelinating process. MRI BRAIN WO:  Moderate acute lacunar infarct left basal ganglia to corona radiata. Otherwise, relatively normal appearance of the brain. CTA HEAD & NECK:  No evidence of dissection, occlusion, aneurysm, or hemodynamically significant stenosis of the arteries in the head/neck. ECHO WITH BUBBLE STUDY:  1. Left ventricle: The cavity size is normal. Systolic function is normal. The estimated ejection fraction is 55-60%. Wall motion is normal; there are no regional wall motion abnormalities. Grade I diastolic dysfunction. 2. Right ventricle: The cavity size is normal. Systolic function is normal. 3. No significant valve disease is identified. 4. Atrial septum: No defect or patent foramen ovale is identified. Negative bubble study.  30 DAY CARDIAC EVENT MONITOR:  The patient was monitored for a total of 27d 20h, underlying rhythm is Sinus. The minimum heart rate was 46 bpm; the maximum 143 bpm; the average 76 bpm. No A-fib, AV block, pauses, VT or VF identified. There were a total of 4 PVCs with 1 morphologies and 0 couplets. Overall PVC Burden at < 0.01 % There were a total of 0 Other Beats. There were 0 total number of paced beats. There were a total of 2070 PSVCs with 1 morphologies and 0 couplets. Overall PSVC  Burden at 0.07 % There is a total of 6 patient events: Dizziness/near syncope associated with normal sinus rhythm, rapid/fast heartbeat associated with mild sinus tachycardia, palpitations/skipping associated with mild sinus tachycardia. Benign-appearing cardiac event monitor. LABS:  Lipid panel with chol 191, TG 653, HDL 28 and direct-LDL 118;   Past Medical History: Past Medical History:  Diagnosis Date   Anxiety and depression    GERD (gastroesophageal reflux disease)    History of CVA (cerebrovascular accident)    L basal ganglia 10/2021  Echo normal.   Hypercholesterolemia    Hypertension    Migraine    "maybe once/wk" (10/15/2014)   Palpitations    Event monitor 2023/2024 benign   Peptic ulcer disease    Polycystic ovarian syndrome    Prediabetes    06/2022 a1c 6.4%-->metformin started   Recurrent UTI    Vitamin D deficiency     Medications: Outpatient Encounter Medications as of 10/18/2022  Medication Sig   acetaminophen (TYLENOL) 325 MG tablet Take 650 mg by mouth every 6 (six) hours as needed for mild pain, moderate pain or headache.    amLODipine (NORVASC) 10 MG tablet Take 1 tablet (10 mg total) by mouth daily.   ASPIRIN 81 PO Take by mouth.   cyclobenzaprine (FLEXERIL) 10 MG tablet Take 10 mg by mouth 2 (two) times daily as needed.   LORazepam (  ATIVAN) 0.5 MG tablet 1 tab po bid as needed   metFORMIN (GLUCOPHAGE) 500 MG tablet Take 1 tablet (500 mg total) by mouth 2 (two) times daily with a meal.   phentermine 37.5 MG capsule Take 1 capsule (37.5 mg total) by mouth every morning.   rosuvastatin (CRESTOR) 40 MG tablet Take 1 tablet (40 mg total) by mouth daily.   sulfamethoxazole-trimethoprim (BACTRIM DS) 800-160 MG tablet Take 1 tablet by mouth 2 (two) times daily.   Vitamin D-Vitamin K (VITAMIN K2-VITAMIN D3 PO) Take by mouth daily. 10,000 iu +   No facility-administered encounter medications on file as of 10/18/2022.    Allergies: Allergies  Allergen  Reactions   Latex Itching, Rash and Hives   Shellfish Allergy Itching and Swelling    Rash / Swelling  Rash / Swelling Rash / Swelling    Family History: Family History  Problem Relation Age of Onset   Migraines Mother    Depression Mother    Diabetes Mother    Hyperlipidemia Mother    Hypertension Mother    Mental illness Mother    Alcohol abuse Father    Seizures Brother    Multiple sclerosis Maternal Grandmother    Migraines Maternal Grandmother    Arthritis Maternal Grandmother    Depression Maternal Grandmother    Early death Maternal Grandmother    Hyperlipidemia Maternal Grandmother    Hypertension Maternal Grandmother    Kidney disease Maternal Grandmother    Mental illness Maternal Grandmother    Heart attack Maternal Grandfather    Heart disease Maternal Grandfather    Migraines Maternal Aunt    Depression Daughter    Learning disabilities Daughter    Mental illness Daughter    Asthma Son    Birth defects Son    Learning disabilities Son     Observations/Objective:   No acute distress.  Alert and oriented.  Speech fluent and not dysarthric.  Language intact.     Follow Up Instructions:    -I discussed the assessment and treatment plan with the patient. The patient was provided an opportunity to ask questions and all were answered. The patient agreed with the plan and demonstrated an understanding of the instructions.   The patient was advised to call back or seek an in-person evaluation if the symptoms worsen or if the condition fails to improve as anticipated.   Cira Servant, DO

## 2022-11-08 NOTE — Telephone Encounter (Signed)
Patient meets criteria:  She averages one migraine a month that aborts in 15-30 minutes WITH UBRELVY (less than 15 days a month).  She is not on any medications that are contraindicated with Bernita Raisin.  Kidney function from 8/16 was normal     PA restarted with Dr.Jaffe note Above. Case# [id number]. Response will be faxed to the office.   Copy of PA Auth form will be scanned to chart.

## 2022-11-09 NOTE — Telephone Encounter (Signed)
PA approved ubrelvy 100 mg. Eff: 11/08/22-11/08/23

## 2022-11-10 ENCOUNTER — Other Ambulatory Visit: Payer: Self-pay | Admitting: Neurology

## 2022-12-17 ENCOUNTER — Other Ambulatory Visit: Payer: Self-pay | Admitting: Family Medicine

## 2023-01-04 DIAGNOSIS — Z0000 Encounter for general adult medical examination without abnormal findings: Secondary | ICD-10-CM | POA: Diagnosis not present

## 2023-01-04 DIAGNOSIS — L659 Nonscarring hair loss, unspecified: Secondary | ICD-10-CM | POA: Diagnosis not present

## 2023-01-04 DIAGNOSIS — Z01419 Encounter for gynecological examination (general) (routine) without abnormal findings: Secondary | ICD-10-CM | POA: Diagnosis not present

## 2023-01-04 DIAGNOSIS — N939 Abnormal uterine and vaginal bleeding, unspecified: Secondary | ICD-10-CM | POA: Diagnosis not present

## 2023-01-04 DIAGNOSIS — F332 Major depressive disorder, recurrent severe without psychotic features: Secondary | ICD-10-CM | POA: Diagnosis not present

## 2023-01-21 ENCOUNTER — Ambulatory Visit: Payer: Medicaid Other | Admitting: Neurology

## 2023-01-21 DIAGNOSIS — F3132 Bipolar disorder, current episode depressed, moderate: Secondary | ICD-10-CM | POA: Diagnosis not present

## 2023-01-23 ENCOUNTER — Telehealth: Payer: Self-pay | Admitting: Internal Medicine

## 2023-01-23 NOTE — Telephone Encounter (Signed)
Prior authorization was timed out, this request has been resubmitted and will have this scheduled once the request has been approved

## 2023-01-23 NOTE — Telephone Encounter (Signed)
Patient checking on scheduling home sleep test. Patient phone number is [phone].

## 2023-01-25 NOTE — Telephone Encounter (Signed)
Patient ready to be scheduled.

## 2023-02-22 ENCOUNTER — Telehealth: Payer: Self-pay | Admitting: Internal Medicine

## 2023-02-22 NOTE — Telephone Encounter (Signed)
 Patient is ready to be scheduled for her HST. She called at [phone]

## 2023-03-07 ENCOUNTER — Encounter: Payer: Self-pay | Admitting: Family Medicine

## 2023-03-07 ENCOUNTER — Ambulatory Visit: Payer: Medicaid Other | Admitting: Family Medicine

## 2023-03-07 VITALS — BP 148/90 | HR 90 | Wt 195.0 lb

## 2023-03-07 DIAGNOSIS — Z7689 Persons encountering health services in other specified circumstances: Secondary | ICD-10-CM

## 2023-03-07 DIAGNOSIS — F419 Anxiety disorder, unspecified: Secondary | ICD-10-CM

## 2023-03-07 DIAGNOSIS — I10 Essential (primary) hypertension: Secondary | ICD-10-CM | POA: Diagnosis not present

## 2023-03-07 DIAGNOSIS — E782 Mixed hyperlipidemia: Secondary | ICD-10-CM

## 2023-03-07 DIAGNOSIS — R7303 Prediabetes: Secondary | ICD-10-CM

## 2023-03-07 DIAGNOSIS — F32A Depression, unspecified: Secondary | ICD-10-CM

## 2023-03-07 NOTE — Progress Notes (Signed)
OFFICE VISIT  03/10/2023  CC:  Chief Complaint  Patient presents with   Anxiety    Patient is a 33 y.o. female who presents for weight management, GAD , prediabetes, and uncontrolled hypertension. A/P as of last visit: "#1 trigger point in left scapular area.  Significantly improved.  No new treatment today.   2.  Uncontrolled hypertension. Improved with increase of amlodipine to 10 mg daily. Encouraged her to check her blood pressure 1-2 times a week at home and if not consistently near 130/80 then call return. Electrolytes and creatinine today.   3.  Hypercholesterolemia.  She is on rosuvastatin 40 mg a day. Most recent LDL about 6 months ago was 135. Lipid panel today.  Hepatic panel normal 3 months ago.   #4 UTI.   She was unable to produce a urine specimen today. Bactrim double strength 1 twice daily x 5 days."  INTERIM HX: Has been through bad depression since I last saw her, has not taken her meds much at all.  Saw GYN MD 01/04/23 for vag bleeding and dep--->pt says started on zoloft but the note says lexapro.  Not clear if pt still taking or not.   Plan was to get pelvic u/s and likely endometr bx. CBC, CMET all normal. Lipids: LDL 101, trig 190s.  Saw psychiatrist 01/21/23--->[facility], Dr. Evaristo Bury.  I'm not able to see the encounter note in EPIC.  Dx listed is bipolar disorder.  She feels a little better from mood stdpt, +suicidal thoughts in the past but none currently.  Motivation better lately. No hypomanic/manic sx's.   PMP AWARE reviewed today: most recent rx for phentermine 37.5 mg was filled 01/14/2023, # 90, rx by me.  Most recent lorazepam 0.5 mg prescription filled 12/17/2022, #30, prescription by me. No red flags.  Past Medical History:  Diagnosis Date   Anxiety and depression    GERD (gastroesophageal reflux disease)    History of CVA (cerebrovascular accident)    L basal ganglia 10/2021  Echo normal.   Hypercholesterolemia    Hypertension     Migraine    "maybe once/wk" (10/15/2014)   Palpitations    Event monitor 2023/2024 benign   Peptic ulcer disease    Polycystic ovarian syndrome    Prediabetes    06/2022 a1c 6.4%-->metformin started   Recurrent UTI    Vitamin D deficiency     Past Surgical History:  Procedure Laterality Date   BLADDER REPAIR     CESAREAN SECTION  2021   CHOLECYSTECTOMY  2022   CYSTOSCOPY  2022   TUBAL LIGATION Bilateral 2021    Outpatient Medications Prior to Visit  Medication Sig Dispense Refill   acetaminophen (TYLENOL) 325 MG tablet Take 650 mg by mouth every 6 (six) hours as needed for mild pain, moderate pain or headache.      amLODipine (NORVASC) 10 MG tablet Take 1 tablet (10 mg total) by mouth daily. 90 tablet 1   ASPIRIN 81 PO Take by mouth.     cyclobenzaprine (FLEXERIL) 10 MG tablet Take 10 mg by mouth 2 (two) times daily as needed.     Fremanezumab-vfrm (AJOVY) 225 MG/1.5ML SOAJ Inject 225 mg into the skin every 28 (twenty-eight) days. 1.68 mL 11   LORazepam (ATIVAN) 0.5 MG tablet TAKE 1 TABLET BY MOUTH TWICE A DAY AS NEEDED 30 tablet 1   metFORMIN (GLUCOPHAGE) 500 MG tablet Take 1 tablet (500 mg total) by mouth 2 (two) times daily with a meal. 180 tablet 1  phentermine 37.5 MG capsule Take 1 capsule (37.5 mg total) by mouth every morning. 90 capsule 1   rosuvastatin (CRESTOR) 40 MG tablet Take 1 tablet (40 mg total) by mouth daily. 90 tablet 1   sulfamethoxazole-trimethoprim (BACTRIM DS) 800-160 MG tablet Take 1 tablet by mouth 2 (two) times daily. 10 tablet 0   Ubrogepant (UBRELVY) 100 MG TABS Take 1 tablet (100 mg total) by mouth as needed. May repeat after 2 hours.  Maximum 2 tablets in 24 hours. 16 tablet 11   Vitamin D-Vitamin K (VITAMIN K2-VITAMIN D3 PO) Take by mouth daily. 10,000 iu +     No facility-administered medications prior to visit.    Allergies  Allergen Reactions   Latex Itching, Rash and Hives   Shellfish Allergy Itching and Swelling    Rash /  Swelling  Rash / Swelling Rash / Swelling    Review of Systems As per HPI  PE:    03/07/2023    1:06 PM 10/05/2022   10:08 AM 10/05/2022    9:49 AM  Vitals with BMI  Weight 195 lbs  179 lbs 3 oz  Systolic 148 134 161  Diastolic 90 84 94  Pulse 90  97     Physical Exam  Gen: Alert, well appearing.  Patient is oriented to person, place, time, and situation. AFFECT: pleasant, lucid thought and speech. No further exam today.  LABS:  Last CBC Lab Results  Component Value Date   WBC 11.5 (H) 06/21/2022   HGB 12.7 06/21/2022   HCT 38.4 06/21/2022   MCV 85.9 06/21/2022   MCH 27.7 07/15/2021   RDW 13.9 06/21/2022   PLT 204.0 06/21/2022   Last metabolic panel Lab Results  Component Value Date   GLUCOSE 109 (H) 10/05/2022   NA 138 10/05/2022   K 3.8 10/05/2022   CL 99 10/05/2022   CO2 32 10/05/2022   BUN 16 10/05/2022   CREATININE 0.82 10/05/2022   GFR 94.14 10/05/2022   CALCIUM 10.0 10/05/2022   PHOS 2.7 10/15/2014   PROT 6.6 06/21/2022   ALBUMIN 4.1 06/21/2022   BILITOT 0.3 06/21/2022   ALKPHOS 81 06/21/2022   AST 15 06/21/2022   ALT 21 06/21/2022   ANIONGAP 8 07/15/2021   Last lipids Lab Results  Component Value Date   CHOL 140 10/05/2022   HDL 49.70 10/05/2022   LDLCALC 62 10/05/2022   LDLDIRECT 135.0 05/14/2022   TRIG 141.0 10/05/2022   CHOLHDL 3 10/05/2022   Last hemoglobin A1c Lab Results  Component Value Date   HGBA1C 6.1 (A) 09/21/2022   HGBA1C 6.1 09/21/2022   HGBA1C 6.1 09/21/2022   HGBA1C 6.1 09/21/2022   Last thyroid functions Lab Results  Component Value Date   TSH 1.43 06/21/2022   Last vitamin D Lab Results  Component Value Date   VD25OH 75.62 08/29/2022   IMPRESSION AND PLAN:  1) HTN, control is fair, compliance with BP med poor lately. Encouraged pt, she says she will restart meds now-->amlod 10 every day. Lytes/cr normal 2 mo ago at [facility].  2) Mixed HLD-->doing well on crestor 40mg  every day in the past.   LDL 101,  trig 190 Nov 2024 at [facility]. Needs to restart med.  3) Prediabetes, was on metformin since summer 2024 when A1c was 6.4%.  Encouraged restart of med.  4) Obesity/weight management.  Her BMI has hovered around 30. Has been on phentermine 37.5 mg daily in the past but nothing lately d/t depression.  5) Anx/dep.  Suspect bipolar disorder. She will be following up with psychiatrist. Not taking psychotropic med at this time other than prn lorazepam, no rx needed today.  6) DUB, GYN to do pelvic u/s and possible endomet bx per their note 01/04/23.  An After Visit Summary was printed and given to the patient.  FOLLOW UP: Return in about 4 weeks (around 04/04/2023) for routine chronic illness f/u. Next CPE 06/2023 Signed:  Santiago Bumpers, MD           03/10/2023

## 2023-03-13 ENCOUNTER — Encounter: Payer: Self-pay | Admitting: Neurology

## 2023-04-04 DIAGNOSIS — R0789 Other chest pain: Secondary | ICD-10-CM | POA: Diagnosis not present

## 2023-04-04 DIAGNOSIS — I10 Essential (primary) hypertension: Secondary | ICD-10-CM | POA: Diagnosis not present

## 2023-04-04 DIAGNOSIS — Z8673 Personal history of transient ischemic attack (TIA), and cerebral infarction without residual deficits: Secondary | ICD-10-CM | POA: Diagnosis not present

## 2023-04-09 DIAGNOSIS — N939 Abnormal uterine and vaginal bleeding, unspecified: Secondary | ICD-10-CM | POA: Diagnosis not present

## 2023-04-09 DIAGNOSIS — N859 Noninflammatory disorder of uterus, unspecified: Secondary | ICD-10-CM | POA: Diagnosis not present

## 2023-04-09 DIAGNOSIS — Z01812 Encounter for preprocedural laboratory examination: Secondary | ICD-10-CM | POA: Diagnosis not present

## 2023-04-11 NOTE — Patient Instructions (Signed)

## 2023-04-12 ENCOUNTER — Other Ambulatory Visit: Payer: Self-pay

## 2023-04-12 ENCOUNTER — Encounter: Payer: Self-pay | Admitting: Family Medicine

## 2023-04-12 ENCOUNTER — Ambulatory Visit: Payer: Medicaid Other | Admitting: Family Medicine

## 2023-04-12 VITALS — BP 130/86 | HR 80 | Ht 65.0 in | Wt 194.0 lb

## 2023-04-12 DIAGNOSIS — I10 Essential (primary) hypertension: Secondary | ICD-10-CM | POA: Diagnosis not present

## 2023-04-12 DIAGNOSIS — F411 Generalized anxiety disorder: Secondary | ICD-10-CM

## 2023-04-12 DIAGNOSIS — Z79899 Other long term (current) drug therapy: Secondary | ICD-10-CM | POA: Diagnosis not present

## 2023-04-12 DIAGNOSIS — Z7689 Persons encountering health services in other specified circumstances: Secondary | ICD-10-CM | POA: Diagnosis not present

## 2023-04-12 DIAGNOSIS — R7303 Prediabetes: Secondary | ICD-10-CM | POA: Diagnosis not present

## 2023-04-12 LAB — POCT GLYCOSYLATED HEMOGLOBIN (HGB A1C)
HbA1c POC (<> result, manual entry): 6 % (ref 4.0–5.6)
HbA1c, POC (controlled diabetic range): 6 % (ref 0.0–7.0)
HbA1c, POC (prediabetic range): 6 % (ref 5.7–6.4)
Hemoglobin A1C: 6 % — AB (ref 4.0–5.6)

## 2023-04-12 MED ORDER — ROSUVASTATIN CALCIUM 40 MG PO TABS: 40.0000 mg | ORAL_TABLET | Freq: Every day | ORAL | 1 refills | Status: DC

## 2023-04-12 MED ORDER — METFORMIN HCL 500 MG PO TABS: 500.0000 mg | ORAL_TABLET | Freq: Two times a day (BID) | ORAL | 1 refills | Status: DC

## 2023-04-12 MED ORDER — AMLODIPINE BESYLATE 10 MG PO TABS: 10.0000 mg | ORAL_TABLET | Freq: Every day | ORAL | 1 refills | Status: DC

## 2023-04-12 MED ORDER — PHENTERMINE HCL 37.5 MG PO CAPS: 37.5000 mg | ORAL_CAPSULE | ORAL | 1 refills | Status: DC

## 2023-04-12 MED ORDER — LORAZEPAM 0.5 MG PO TABS: ORAL_TABLET | ORAL | 1 refills | Status: DC

## 2023-04-12 MED ORDER — PHENTERMINE HCL 37.5 MG PO CAPS
37.5000 mg | ORAL_CAPSULE | ORAL | 1 refills | Status: DC
Start: 2023-04-12 — End: 2023-04-12

## 2023-04-12 NOTE — Progress Notes (Signed)
 OFFICE VISIT  04/28/2023  CC:  Chief Complaint  Patient presents with   Medical Management of Chronic Issues    Patient is a 33 y.o. female who presents for 1 month follow-up anxiety and depression, hypertension, and prediabetes. A/P as of last visit: "1) HTN, control is fair, compliance with BP med poor lately. Encouraged pt, she says she will restart meds now-->amlod 10 every day. Lytes/cr normal 2 mo ago at [facility].   2) Mixed HLD-->doing well on crestor 40mg  every day in the past.   LDL 101, trig 190 Nov 2024 at [facility]. Needs to restart med.   3) Prediabetes, was on metformin since summer 2024 when A1c was 6.4%.  Encouraged restart of med.   4) Obesity/weight management.  Her BMI has hovered around 30. Has been on phentermine 37.5 mg daily in the past but nothing lately d/t depression.   5) Anx/dep.  Suspect bipolar disorder. She will be following up with psychiatrist. Not taking psychotropic med at this time other than prn lorazepam, no rx needed today.   6) DUB, GYN to do pelvic u/s and possible endomet bx per their note 01/04/23."  INTERIM HX: She is still working on getting psychiatry set up.  Still with anhedonia, poor concentration.  Since I saw her last, her cardiologist has planned exercise stress echo to evaluate some chest pain she has been having. Her blood pressure was normal at her gynecologist 3 days ago.  GYN is planning to do TAH and bilat salpingetomy for her DUB.  Weight stable on phentermine daily. PMP AWARE reviewed today: most recent rx for phentermine was filled 01/14/23, #90, rx by me. Most recent lorazepam rx filled 12/17/22, #30, rx by me. No red flags.  Review of systems: No fever, no dizziness, no palpitations, no presyncope, no myalgias or arthralgias.  Past Medical History:  Diagnosis Date   Anxiety and depression    GERD (gastroesophageal reflux disease)    History of CVA (cerebrovascular accident)    L basal ganglia 10/2021  Echo normal.    Hypercholesterolemia    Hypertension    Migraine    "maybe once/wk" (10/15/2014)   Palpitations    Event monitor 2023/2024 benign   Peptic ulcer disease    Polycystic ovarian syndrome    Prediabetes    06/2022 a1c 6.4%-->metformin started   Recurrent UTI    Vitamin D deficiency     Past Surgical History:  Procedure Laterality Date   BLADDER REPAIR     CESAREAN SECTION  2021   CHOLECYSTECTOMY  2022   CYSTOSCOPY  2022   TUBAL LIGATION Bilateral 2021    Outpatient Medications Prior to Visit  Medication Sig Dispense Refill   acetaminophen (TYLENOL) 325 MG tablet Take 650 mg by mouth every 6 (six) hours as needed for mild pain, moderate pain or headache.      ASPIRIN 81 PO Take by mouth.     cyclobenzaprine (FLEXERIL) 10 MG tablet Take 10 mg by mouth 2 (two) times daily as needed.     Fremanezumab-vfrm (AJOVY) 225 MG/1.5ML SOAJ Inject 225 mg into the skin every 28 (twenty-eight) days. 1.68 mL 11   Ubrogepant (UBRELVY) 100 MG TABS Take 1 tablet (100 mg total) by mouth as needed. May repeat after 2 hours.  Maximum 2 tablets in 24 hours. 16 tablet 11   Vitamin D-Vitamin K (VITAMIN K2-VITAMIN D3 PO) Take by mouth daily. 10,000 iu +     LORazepam (ATIVAN) 0.5 MG tablet TAKE 1 TABLET  BY MOUTH TWICE A DAY AS NEEDED 30 tablet 1   amLODipine (NORVASC) 10 MG tablet Take 1 tablet (10 mg total) by mouth daily. 90 tablet 1   metFORMIN (GLUCOPHAGE) 500 MG tablet Take 1 tablet (500 mg total) by mouth 2 (two) times daily with a meal. 180 tablet 1   phentermine 37.5 MG capsule Take 1 capsule (37.5 mg total) by mouth every morning. 90 capsule 1   rosuvastatin (CRESTOR) 40 MG tablet Take 1 tablet (40 mg total) by mouth daily. 90 tablet 1   sulfamethoxazole-trimethoprim (BACTRIM DS) 800-160 MG tablet Take 1 tablet by mouth 2 (two) times daily. 10 tablet 0   No facility-administered medications prior to visit.    Allergies  Allergen Reactions   Latex Itching, Rash and Hives   Shellfish Allergy  Itching and Swelling    Rash / Swelling  Rash / Swelling Rash / Swelling    Review of Systems As per HPI  PE:    04/12/2023    1:11 PM 03/07/2023    1:06 PM 10/05/2022   10:08 AM  Vitals with BMI  Height 5\' 5"     Weight 194 lbs 195 lbs   BMI 32.28    Systolic 130 148 696  Diastolic 86 90 84  Pulse 80 90     Physical Exam  Gen: Alert, well appearing.  Patient is oriented to person, place, time, and situation. AFFECT: pleasant, lucid thought and speech. No further exam today.  LABS:  Last CBC Lab Results  Component Value Date   WBC 11.5 (H) 06/21/2022   HGB 12.7 06/21/2022   HCT 38.4 06/21/2022   MCV 85.9 06/21/2022   MCH 27.7 07/15/2021   RDW 13.9 06/21/2022   PLT 204.0 06/21/2022   Last metabolic panel Lab Results  Component Value Date   GLUCOSE 109 (H) 10/05/2022   NA 138 10/05/2022   K 3.8 10/05/2022   CL 99 10/05/2022   CO2 32 10/05/2022   BUN 16 10/05/2022   CREATININE 0.82 10/05/2022   GFR 94.14 10/05/2022   CALCIUM 10.0 10/05/2022   PHOS 2.7 10/15/2014   PROT 6.6 06/21/2022   ALBUMIN 4.1 06/21/2022   BILITOT 0.3 06/21/2022   ALKPHOS 81 06/21/2022   AST 15 06/21/2022   ALT 21 06/21/2022   ANIONGAP 8 07/15/2021   Last lipids Lab Results  Component Value Date   CHOL 140 10/05/2022   HDL 49.70 10/05/2022   LDLCALC 62 10/05/2022   LDLDIRECT 135.0 05/14/2022   TRIG 141.0 10/05/2022   CHOLHDL 3 10/05/2022   Last hemoglobin A1c Lab Results  Component Value Date   HGBA1C 6.0 (A) 04/12/2023   HGBA1C 6.0 04/12/2023   HGBA1C 6.0 04/12/2023   HGBA1C 6.0 04/12/2023   Last thyroid functions Lab Results  Component Value Date   TSH 1.43 06/21/2022   Last vitamin D Lab Results  Component Value Date   VD25OH 75.62 08/29/2022   IMPRESSION AND PLAN:  #1 prediabetes, doing well on metformin 500 mg twice a day. Hemoglobin A1c is 6% today.  #2 hypertension well-controlled on amlodipine 10 mg a day.  3.  Major depressive disorder,  generalized anxiety disorder. She is on sertraline, management per gynecology.  She is not completely sure of her dose at this time. She is still in the process of setting up psychiatry evaluation. She is not using lorazepam much at all.  A new prescription was not needed today.  4.  Weight management.  She is taking phentermine 37.5  mg/day. She is still working on maximizing efforts at diet and exercise.  Weight has been stable. Phentermine # 90 with 1 additional refill sent today.  An After Visit Summary was printed and given to the patient.  FOLLOW UP: Return in about 3 months (around 07/10/2023) for routine chronic illness f/u.  Signed:  Santiago Bumpers, MD           04/28/2023

## 2023-04-15 MED ORDER — METFORMIN HCL 500 MG PO TABS: 500.0000 mg | ORAL_TABLET | Freq: Two times a day (BID) | ORAL | 1 refills | Status: AC

## 2023-04-15 MED ORDER — ROSUVASTATIN CALCIUM 40 MG PO TABS: 40.0000 mg | ORAL_TABLET | Freq: Every day | ORAL | 1 refills | Status: AC

## 2023-04-15 MED ORDER — LORAZEPAM 0.5 MG PO TABS: ORAL_TABLET | ORAL | 1 refills | Status: AC

## 2023-04-15 MED ORDER — PHENTERMINE HCL 37.5 MG PO CAPS: 37.5000 mg | ORAL_CAPSULE | ORAL | 1 refills | Status: AC

## 2023-04-15 MED ORDER — AMLODIPINE BESYLATE 10 MG PO TABS: 10.0000 mg | ORAL_TABLET | Freq: Every day | ORAL | 1 refills | Status: AC

## 2023-04-17 DIAGNOSIS — F17210 Nicotine dependence, cigarettes, uncomplicated: Secondary | ICD-10-CM | POA: Diagnosis not present

## 2023-04-17 DIAGNOSIS — Z01812 Encounter for preprocedural laboratory examination: Secondary | ICD-10-CM | POA: Diagnosis not present

## 2023-04-17 DIAGNOSIS — Z79899 Other long term (current) drug therapy: Secondary | ICD-10-CM | POA: Diagnosis not present

## 2023-04-17 DIAGNOSIS — Z01810 Encounter for preprocedural cardiovascular examination: Secondary | ICD-10-CM | POA: Diagnosis not present

## 2023-04-17 DIAGNOSIS — N939 Abnormal uterine and vaginal bleeding, unspecified: Secondary | ICD-10-CM | POA: Diagnosis not present

## 2023-04-19 DIAGNOSIS — N939 Abnormal uterine and vaginal bleeding, unspecified: Secondary | ICD-10-CM | POA: Diagnosis not present

## 2023-04-19 DIAGNOSIS — N838 Other noninflammatory disorders of ovary, fallopian tube and broad ligament: Secondary | ICD-10-CM | POA: Diagnosis not present

## 2023-04-19 DIAGNOSIS — G4733 Obstructive sleep apnea (adult) (pediatric): Secondary | ICD-10-CM | POA: Diagnosis not present

## 2023-04-19 DIAGNOSIS — I10 Essential (primary) hypertension: Secondary | ICD-10-CM | POA: Diagnosis not present

## 2023-04-19 DIAGNOSIS — N841 Polyp of cervix uteri: Secondary | ICD-10-CM | POA: Diagnosis not present

## 2023-04-19 DIAGNOSIS — E669 Obesity, unspecified: Secondary | ICD-10-CM | POA: Diagnosis not present

## 2023-04-19 DIAGNOSIS — F32A Depression, unspecified: Secondary | ICD-10-CM | POA: Diagnosis not present

## 2023-04-19 DIAGNOSIS — N72 Inflammatory disease of cervix uteri: Secondary | ICD-10-CM | POA: Diagnosis not present

## 2023-04-22 ENCOUNTER — Ambulatory Visit: Payer: Medicaid Other | Admitting: Neurology

## 2023-04-24 ENCOUNTER — Telehealth: Payer: Self-pay

## 2023-04-24 NOTE — Telephone Encounter (Signed)
 PA ajovy needed

## 2023-04-26 ENCOUNTER — Other Ambulatory Visit (HOSPITAL_COMMUNITY): Payer: Self-pay

## 2023-04-26 ENCOUNTER — Telehealth: Payer: Self-pay

## 2023-04-26 NOTE — Telephone Encounter (Signed)
 PA request has been Submitted. New Encounter has been or will be created for follow up. For additional info see Pharmacy Prior Auth telephone encounter from 03/07.

## 2023-04-26 NOTE — Telephone Encounter (Signed)
*  [facility]  Pharmacy Patient Advocate Encounter   Received notification from Pt Calls Messages that prior authorization for AJOVY (fremanezumab-vfrm) injection 225MG /1.5ML auto-injectors  is required/requested.   Insurance verification completed.   The patient is insured through [facility] .   Per test claim: PA required; PA submitted to above mentioned insurance via CoverMyMeds Key/confirmation #/EOC [id number] Status is pending

## 2023-04-29 NOTE — Telephone Encounter (Signed)
 Pharmacy Patient Advocate Encounter  Received notification from [facility] that Prior Authorization for AJOVY 225MG  has been APPROVED from 3.7.25 to 3.7.26   PA #/Case ID/Reference #: [id number]

## 2023-04-29 NOTE — Progress Notes (Unsigned)
 Virtual Visit via Video Note  Consent was obtained for video visit:  Yes.   Answered questions that patient had about telehealth interaction:  Yes.   I discussed the limitations, risks, security and privacy concerns of performing an evaluation and management service by telemedicine. I also discussed with the patient that there may be a patient responsible charge related to this service. The patient expressed understanding and agreed to proceed.  Pt location: Home Physician Location: office Name of referring provider:  Jeoffrey Massed, MD I connected with Betty Dean at patients initiation/request on 04/30/2023 at 10:50 AM EDT by video enabled telemedicine application and verified that I am speaking with the correct person using two identifiers. Pt MRN:  [id number] Pt DOB:  03-10-1990 Video Participants:  Betty Dean   Assessment/Plan:   Migraine with and without aura, without status migrainosus, not intractable. History of left basal ganglia infarct - likely small vessel disease Hypertension Tobacco use disorder Hyperlipidemia   Migraine prevention:  Ajovy She will have somebody take her to the pharmacy to pick up. Migraine rescue:  Ubrelvy 100mg .  Will look into denial and appeal.  She has failed over the counter analgesics and triptans are contraindicated due to stroke.  In addition, Bernita Raisin has been very effective. Secondary stroke prevention as managed by PCP: Restart ASA 81mg  daily Statin.  LDL goal less than 70.  Will prescribe rosuvastatin 10mg  daily with refills until she can establish care.  Check lipid panel and LFTs. Normotensive blood pressure Hgb A1c goal less than 7 Smoking cessation Mediterranean diet Routine excercise Follow up 6-7 months.       Subjective:  Betty Dean is a 33 year old right-handed female with HTN, tobacco abuse, depression, anxiety, migraine, PCOS and history of stomach ulcer who follows up for stroke and  migraine.  UPDATE: She was able to start Ajovy and Ubrelvy. Significantly improved. Migraines have been occurring once a month lasting 15-30 minutes with Bernita Raisin.  However, she has not been able to take her last Ajovy dose due to needing another prior authorization.  She was supposed to take it almost 2 weeks ago.   It has been approved and she plans to pick it up due to recently having a hysterectomy and not cleared yet to drive.  Thankfully, no significant worsening for now, maybe just a couple of headaches.    Current migraine rescue:  Tylenol   Current migraine preventative:  none Current muscle relaxant:  Flexeril Current CGRP inhibitor:  Ashby Dawes 100mg  Other medications:  ASA 81mg  daily, rosuvastatin 40mg , amlodipine, metformin  She is down to smoking 3-5 cigarettes a day.   Hgb A1c from 2/26 6.2   HISTORY: On 11/11/2021, she developed word-finding difficulty, could not write her name and had difficulty with simple tasks such as typing on her phone.  Also noticed some right arm and hand weakness.  Some difficulty with coordination of her right foot and hand.  No associated headache, dizziness, visual disturbance, unilateral numbness or weakness.  She has history of classic migraines which are manageable.  Denies recent head trauma.  For the previous month, she had been experiencing dizziness and palpitations.  She was evaluated by cardiology and underwent a 4 week cardiac event monitor which revealed 4 PVCs with 1 morphologies but no a fib, AV block or pauses or other significant arrhythmias.  She went to [facility] where she was admitted.  Blood pressure in the ED was 205/112.  CT perfusion was  negative but CT head demonstrated decreased density in the left basal ganglia.  CTA of head and neck revealed  no LVO, hemodynamically significant stenosis or dissection.  MRI of brain confirmed a moderate lacunar infarct within the left basal ganglia.  Echo with bubble study showed EF 55-60%  with no PFO.  She was discharged on dual antiplatelet therapy (ASA 81mg  and Plavix) followed by ASA alone.  She was also started on Crestor.  Hypercoagulable panel on 12/11/2021 was normal.     Overall, symptoms are much improved.  Still feel slightly weak in right hand and sometimes may have trouble with words.  Has cut down on smoking from 1 1/2 ppd to 5-6 cigarettes daily.   She also has migraines.  Migraines are severe bilateral retro-orbital pounding pain with nausea, vomiting, photophobia, phonophobia and sometimes with blurred vision or sees stars.  Usually lasts 1-2 days and occurs once a month.    Past migraine rescue:  Fioricet, rizatriptan (Triptans contraindicated) Past migraine prevention:  topiramate, propranolol   CT PERFUSION W:  No evidence of ischemia in the sampled regions of the brain CT HEAD WO:  1. No acute cortical stroke. 2. Small region of decreased density in the left basal ganglia and extending into the left periventricular white matter. This may represent lacune or can be seen with demyelinating process. MRI BRAIN WO:  Moderate acute lacunar infarct left basal ganglia to corona radiata. Otherwise, relatively normal appearance of the brain. CTA HEAD & NECK:  No evidence of dissection, occlusion, aneurysm, or hemodynamically significant stenosis of the arteries in the head/neck. ECHO WITH BUBBLE STUDY:  1. Left ventricle: The cavity size is normal. Systolic function is normal. The estimated ejection fraction is 55-60%. Wall motion is normal; there are no regional wall motion abnormalities. Grade I diastolic dysfunction. 2. Right ventricle: The cavity size is normal. Systolic function is normal. 3. No significant valve disease is identified. 4. Atrial septum: No defect or patent foramen ovale is identified. Negative bubble study.  30 DAY CARDIAC EVENT MONITOR:  The patient was monitored for a total of 27d 20h, underlying rhythm is Sinus. The minimum heart rate was 46 bpm; the  maximum 143 bpm; the average 76 bpm. No A-fib, AV block, pauses, VT or VF identified. There were a total of 4 PVCs with 1 morphologies and 0 couplets. Overall PVC Burden at < 0.01 % There were a total of 0 Other Beats. There were 0 total number of paced beats. There were a total of 2070 PSVCs with 1 morphologies and 0 couplets. Overall PSVC Burden at 0.07 % There is a total of 6 patient events: Dizziness/near syncope associated with normal sinus rhythm, rapid/fast heartbeat associated with mild sinus tachycardia, palpitations/skipping associated with mild sinus tachycardia. Benign-appearing cardiac event monitor.    Past Medical History: Past Medical History:  Diagnosis Date   Anxiety and depression    GERD (gastroesophageal reflux disease)    History of CVA (cerebrovascular accident)    L basal ganglia 10/2021  Echo normal.   Hypercholesterolemia    Hypertension    Migraine    "maybe once/wk" (10/15/2014)   Palpitations    Event monitor 2023/2024 benign   Peptic ulcer disease    Polycystic ovarian syndrome    Prediabetes    06/2022 a1c 6.4%-->metformin started   Recurrent UTI    Vitamin D deficiency     Medications: Outpatient Encounter Medications as of 04/30/2023  Medication Sig   acetaminophen (TYLENOL) 325 MG tablet Take  650 mg by mouth every 6 (six) hours as needed for mild pain, moderate pain or headache.    amLODipine (NORVASC) 10 MG tablet Take 1 tablet (10 mg total) by mouth daily.   ASPIRIN 81 PO Take by mouth.   cyclobenzaprine (FLEXERIL) 10 MG tablet Take 10 mg by mouth 2 (two) times daily as needed.   Fremanezumab-vfrm (AJOVY) 225 MG/1.5ML SOAJ Inject 225 mg into the skin every 28 (twenty-eight) days.   LORazepam (ATIVAN) 0.5 MG tablet TAKE 1 TABLET BY MOUTH TWICE A DAY AS NEEDED   metFORMIN (GLUCOPHAGE) 500 MG tablet Take 1 tablet (500 mg total) by mouth 2 (two) times daily with a meal.   phentermine 37.5 MG capsule Take 1 capsule (37.5 mg total) by mouth every morning.    rosuvastatin (CRESTOR) 40 MG tablet Take 1 tablet (40 mg total) by mouth daily.   Ubrogepant (UBRELVY) 100 MG TABS Take 1 tablet (100 mg total) by mouth as needed. May repeat after 2 hours.  Maximum 2 tablets in 24 hours.   Vitamin D-Vitamin K (VITAMIN K2-VITAMIN D3 PO) Take by mouth daily. 10,000 iu +   No facility-administered encounter medications on file as of 04/30/2023.    Allergies: Allergies  Allergen Reactions   Latex Itching, Rash and Hives   Shellfish Allergy Itching and Swelling    Rash / Swelling  Rash / Swelling Rash / Swelling    Family History: Family History  Problem Relation Age of Onset   Migraines Mother    Depression Mother    Diabetes Mother    Hyperlipidemia Mother    Hypertension Mother    Mental illness Mother    Alcohol abuse Father    Seizures Brother    Multiple sclerosis Maternal Grandmother    Migraines Maternal Grandmother    Arthritis Maternal Grandmother    Depression Maternal Grandmother    Early death Maternal Grandmother    Hyperlipidemia Maternal Grandmother    Hypertension Maternal Grandmother    Kidney disease Maternal Grandmother    Mental illness Maternal Grandmother    Heart attack Maternal Grandfather    Heart disease Maternal Grandfather    Migraines Maternal Aunt    Depression Daughter    Learning disabilities Daughter    Mental illness Daughter    Asthma Son    Birth defects Son    Learning disabilities Son     Observations/Objective:   No acute distress.  Alert and oriented.  Speech fluent and not dysarthric.  Language intact.     Follow Up Instructions:    -I discussed the assessment and treatment plan with the patient. The patient was provided an opportunity to ask questions and all were answered. The patient agreed with the plan and demonstrated an understanding of the instructions.   The patient was advised to call back or seek an in-person evaluation if the symptoms worsen or if the condition fails to  improve as anticipated.   Cira Servant, DO   CC: Nicoletta Ba, MD

## 2023-04-30 ENCOUNTER — Encounter: Payer: Self-pay | Admitting: Neurology

## 2023-04-30 ENCOUNTER — Telehealth: Payer: Medicaid Other | Admitting: Neurology

## 2023-04-30 DIAGNOSIS — F17200 Nicotine dependence, unspecified, uncomplicated: Secondary | ICD-10-CM

## 2023-04-30 DIAGNOSIS — I10 Essential (primary) hypertension: Secondary | ICD-10-CM

## 2023-04-30 DIAGNOSIS — E785 Hyperlipidemia, unspecified: Secondary | ICD-10-CM

## 2023-04-30 DIAGNOSIS — Z8673 Personal history of transient ischemic attack (TIA), and cerebral infarction without residual deficits: Secondary | ICD-10-CM

## 2023-04-30 DIAGNOSIS — I6381 Other cerebral infarction due to occlusion or stenosis of small artery: Secondary | ICD-10-CM

## 2023-04-30 DIAGNOSIS — G43009 Migraine without aura, not intractable, without status migrainosus: Secondary | ICD-10-CM | POA: Diagnosis not present

## 2023-04-30 MED ORDER — AJOVY 225 MG/1.5ML ~~LOC~~ SOAJ: 225.0000 mg | SUBCUTANEOUS | 11 refills | Status: AC

## 2023-04-30 MED ORDER — UBRELVY 100 MG PO TABS: 1.0000 | ORAL_TABLET | ORAL | 11 refills | Status: AC | PRN

## 2023-04-30 NOTE — Progress Notes (Signed)
 Per 04/29/23 note, Pharmacy Patient Advocate Encounter   Received notification from [facility] that Prior Authorization for AJOVY 225MG  has been APPROVED from 3.7.25 to 3.7.26    PA #/Case ID/Reference #: [id number]

## 2023-06-27 ENCOUNTER — Ambulatory Visit: Admitting: Family Medicine

## 2023-06-27 VITALS — BP 136/91 | HR 78 | Temp 98.0°F | Ht 65.0 in | Wt 207.2 lb

## 2023-06-27 DIAGNOSIS — R300 Dysuria: Secondary | ICD-10-CM | POA: Diagnosis not present

## 2023-06-27 DIAGNOSIS — N3001 Acute cystitis with hematuria: Secondary | ICD-10-CM

## 2023-06-27 LAB — POCT URINALYSIS DIPSTICK
Bilirubin, UA: NEGATIVE
Blood, UA: POSITIVE
Glucose, UA: NEGATIVE
Ketones, UA: NEGATIVE
Nitrite, UA: NEGATIVE
Protein, UA: NEGATIVE
Spec Grav, UA: 1.01 (ref 1.010–1.025)
Urobilinogen, UA: 0.2 U/dL
pH, UA: 7 (ref 5.0–8.0)

## 2023-06-27 MED ORDER — CIPROFLOXACIN HCL 500 MG PO TABS: 500.0000 mg | ORAL_TABLET | Freq: Two times a day (BID) | ORAL | 0 refills | Status: DC

## 2023-06-27 NOTE — Progress Notes (Signed)
 OFFICE VISIT  06/27/2023  CC:  Chief Complaint  Patient presents with   urinary concern    Symptoms started 4 weeks ago; burning with urination, frequency and, cloudy. Denies low back pain or odor. Has been using cranberry pills    Patient is a 33 y.o. female who presents for urinary concerns.  HPI: For about a month she has been dealing with dysuria, urinary urgency and frequency, and suprapubic pressure.  At one point she was having some low back pain diffusely but that resolved after a couple weeks.  Her urine has been cloudy and dark although this is cleared up some with significant increase fluid intake.  She has not had any fever or nausea or vomiting.  No flank pain, no blood in urine.  Past Medical History:  Diagnosis Date   Anxiety and depression    GERD (gastroesophageal reflux disease)    History of CVA (cerebrovascular accident)    L basal ganglia 10/2021  Echo normal.   Hypercholesterolemia    Hypertension    Migraine    "maybe once/wk" (10/15/2014)   Palpitations    Event monitor 2023/2024 benign   Peptic ulcer disease    Polycystic ovarian syndrome    Prediabetes    06/2022 a1c 6.4%-->metformin  started   Recurrent UTI    Vitamin D  deficiency     Past Surgical History:  Procedure Laterality Date   BLADDER REPAIR     CESAREAN SECTION  2021   CHOLECYSTECTOMY  2022   CYSTOSCOPY  2022   TUBAL LIGATION Bilateral 2021    Outpatient Medications Prior to Visit  Medication Sig Dispense Refill   acetaminophen  (TYLENOL ) 325 MG tablet Take 650 mg by mouth every 6 (six) hours as needed for mild pain, moderate pain or headache.      amLODipine  (NORVASC ) 10 MG tablet Take 1 tablet (10 mg total) by mouth daily. 90 tablet 1   ASPIRIN 81 PO Take by mouth.     cyclobenzaprine (FLEXERIL) 10 MG tablet Take 10 mg by mouth 2 (two) times daily as needed.     escitalopram (LEXAPRO) 20 MG tablet Take 20 mg by mouth daily.     Fremanezumab -vfrm (AJOVY ) 225 MG/1.5ML SOAJ Inject 225  mg into the skin every 28 (twenty-eight) days. 1.68 mL 11   LORazepam  (ATIVAN ) 0.5 MG tablet TAKE 1 TABLET BY MOUTH TWICE A DAY AS NEEDED 30 tablet 1   metFORMIN  (GLUCOPHAGE ) 500 MG tablet Take 1 tablet (500 mg total) by mouth 2 (two) times daily with a meal. 180 tablet 1   phentermine  37.5 MG capsule Take 1 capsule (37.5 mg total) by mouth every morning. 90 capsule 1   rosuvastatin  (CRESTOR ) 40 MG tablet Take 1 tablet (40 mg total) by mouth daily. 90 tablet 1   Ubrogepant  (UBRELVY ) 100 MG TABS Take 1 tablet (100 mg total) by mouth as needed. May repeat after 2 hours.  Maximum 2 tablets in 24 hours. 16 tablet 11   Vitamin D -Vitamin K (VITAMIN K2-VITAMIN D3 PO) Take by mouth daily. 10,000 iu + 120mcg     No facility-administered medications prior to visit.    Allergies  Allergen Reactions   Latex Itching, Rash and Hives   Shellfish Allergy Itching and Swelling    Rash / Swelling  Rash / Swelling Rash / Swelling    Review of Systems  As per HPI  PE:    06/27/2023    8:45 AM 04/12/2023    1:11 PM 03/07/2023  1:06 PM  Vitals with BMI  Height 5\' 5"  5\' 5"    Weight 207 lbs 3 oz 194 lbs 195 lbs  BMI 34.48 32.28   Systolic 136 130 102  Diastolic 91 86 90  Pulse 78 80 90     Physical Exam  Gen: Alert, well appearing.  Patient is oriented to person, place, time, and situation. AFFECT: pleasant, lucid thought and speech. No further exam today  LABS:  Last CBC Lab Results  Component Value Date   WBC 11.5 (H) 06/21/2022   HGB 12.7 06/21/2022   HCT 38.4 06/21/2022   MCV 85.9 06/21/2022   MCH 27.7 07/15/2021   RDW 13.9 06/21/2022   PLT 204.0 06/21/2022   Last metabolic panel Lab Results  Component Value Date   GLUCOSE 109 (H) 10/05/2022   NA 138 10/05/2022   K 3.8 10/05/2022   CL 99 10/05/2022   CO2 32 10/05/2022   BUN 16 10/05/2022   CREATININE 0.82 10/05/2022   GFR 94.14 10/05/2022   CALCIUM  10.0 10/05/2022   PHOS 2.7 10/15/2014   PROT 6.6 06/21/2022   ALBUMIN  4.1 06/21/2022   BILITOT 0.3 06/21/2022   ALKPHOS 81 06/21/2022   AST 15 06/21/2022   ALT 21 06/21/2022   ANIONGAP 8 07/15/2021   IMPRESSION AND PLAN:  Urinary tract infection. UA today: Trace blood, trace leukocytes, otherwise normal. Urine specimen sent for culture and sensitivities. Prescription for ciprofloxacin  500 twice daily x 5 days. Signs/symptoms to call or return for were reviewed and pt expressed understanding.  An After Visit Summary was printed and given to the patient.  FOLLOW UP: Return if symptoms worsen or fail to improve.  Signed:  Arletha Lady, MD           06/27/2023

## 2023-07-01 LAB — URINE CULTURE
MICRO NUMBER:: 16430907
SPECIMEN QUALITY:: ADEQUATE

## 2023-07-10 ENCOUNTER — Ambulatory Visit: Payer: Medicaid Other | Admitting: Family Medicine

## 2023-07-11 ENCOUNTER — Ambulatory Visit: Admitting: Family Medicine

## 2023-07-11 NOTE — Progress Notes (Deleted)
 OFFICE VISIT  07/11/2023  CC: No chief complaint on file.   Patient is a 33 y.o. female who presents for 53-month follow-up anxiety and depression, hypertension, hypercholesterolemia, and prediabetes. A/P as of last visit: "  INTERIM HX: ***  Past Medical History:  Diagnosis Date   Anxiety and depression    GERD (gastroesophageal reflux disease)    History of CVA (cerebrovascular accident)    L basal ganglia 10/2021  Echo normal.   Hypercholesterolemia    Hypertension    Migraine    "maybe once/wk" (10/15/2014)   Palpitations    Event monitor 2023/2024 benign   Peptic ulcer disease    Polycystic ovarian syndrome    Prediabetes    06/2022 a1c 6.4%-->metformin  started   Recurrent UTI    Vitamin D  deficiency     Past Surgical History:  Procedure Laterality Date   BLADDER REPAIR     CESAREAN SECTION  2021   CHOLECYSTECTOMY  2022   CYSTOSCOPY  2022   TUBAL LIGATION Bilateral 2021    Outpatient Medications Prior to Visit  Medication Sig Dispense Refill   acetaminophen  (TYLENOL ) 325 MG tablet Take 650 mg by mouth every 6 (six) hours as needed for mild pain, moderate pain or headache.      amLODipine  (NORVASC ) 10 MG tablet Take 1 tablet (10 mg total) by mouth daily. 90 tablet 1   ASPIRIN 81 PO Take by mouth.     ciprofloxacin  (CIPRO ) 500 MG tablet Take 1 tablet (500 mg total) by mouth 2 (two) times daily. 10 tablet 0   cyclobenzaprine (FLEXERIL) 10 MG tablet Take 10 mg by mouth 2 (two) times daily as needed.     escitalopram (LEXAPRO) 20 MG tablet Take 20 mg by mouth daily.     Fremanezumab -vfrm (AJOVY ) 225 MG/1.5ML SOAJ Inject 225 mg into the skin every 28 (twenty-eight) days. 1.68 mL 11   LORazepam  (ATIVAN ) 0.5 MG tablet TAKE 1 TABLET BY MOUTH TWICE A DAY AS NEEDED 30 tablet 1   metFORMIN  (GLUCOPHAGE ) 500 MG tablet Take 1 tablet (500 mg total) by mouth 2 (two) times daily with a meal. 180 tablet 1   phentermine  37.5 MG capsule Take 1 capsule (37.5 mg total) by mouth every  morning. 90 capsule 1   rosuvastatin  (CRESTOR ) 40 MG tablet Take 1 tablet (40 mg total) by mouth daily. 90 tablet 1   Ubrogepant  (UBRELVY ) 100 MG TABS Take 1 tablet (100 mg total) by mouth as needed. May repeat after 2 hours.  Maximum 2 tablets in 24 hours. 16 tablet 11   Vitamin D -Vitamin K (VITAMIN K2-VITAMIN D3 PO) Take by mouth daily. 10,000 iu + 120mcg     No facility-administered medications prior to visit.    Allergies  Allergen Reactions   Latex Itching, Rash and Hives   Shellfish Allergy Itching and Swelling    Rash / Swelling  Rash / Swelling Rash / Swelling    Review of Systems As per HPI  PE:    06/27/2023    8:45 AM 04/12/2023    1:11 PM 03/07/2023    1:06 PM  Vitals with BMI  Height 5\' 5"  5\' 5"    Weight 207 lbs 3 oz 194 lbs 195 lbs  BMI 34.48 32.28   Systolic 136 130 696  Diastolic 91 86 90  Pulse 78 80 90     Physical Exam  ***  LABS:  Last CBC Lab Results  Component Value Date   WBC 11.5 (H) 06/21/2022  HGB 12.7 06/21/2022   HCT 38.4 06/21/2022   MCV 85.9 06/21/2022   MCH 27.7 07/15/2021   RDW 13.9 06/21/2022   PLT 204.0 06/21/2022   Last metabolic panel Lab Results  Component Value Date   GLUCOSE 109 (H) 10/05/2022   NA 138 10/05/2022   K 3.8 10/05/2022   CL 99 10/05/2022   CO2 32 10/05/2022   BUN 16 10/05/2022   CREATININE 0.82 10/05/2022   GFR 94.14 10/05/2022   CALCIUM  10.0 10/05/2022   PHOS 2.7 10/15/2014   PROT 6.6 06/21/2022   ALBUMIN 4.1 06/21/2022   BILITOT 0.3 06/21/2022   ALKPHOS 81 06/21/2022   AST 15 06/21/2022   ALT 21 06/21/2022   ANIONGAP 8 07/15/2021   Last lipids Lab Results  Component Value Date   CHOL 140 10/05/2022   HDL 49.70 10/05/2022   LDLCALC 62 10/05/2022   LDLDIRECT 135.0 05/14/2022   TRIG 141.0 10/05/2022   CHOLHDL 3 10/05/2022   Last hemoglobin A1c Lab Results  Component Value Date   HGBA1C 6.0 (A) 04/12/2023   HGBA1C 6.0 04/12/2023   HGBA1C 6.0 04/12/2023   HGBA1C 6.0 04/12/2023    Last thyroid  functions Lab Results  Component Value Date   TSH 1.43 06/21/2022   IMPRESSION AND PLAN:  No problem-specific Assessment & Plan notes found for this encounter.   An After Visit Summary was printed and given to the patient.  FOLLOW UP: No follow-ups on file. Due for CPE Signed:  Arletha Lady, MD           07/11/2023

## 2023-11-02 DIAGNOSIS — J029 Acute pharyngitis, unspecified: Secondary | ICD-10-CM | POA: Diagnosis not present

## 2023-11-02 DIAGNOSIS — J069 Acute upper respiratory infection, unspecified: Secondary | ICD-10-CM | POA: Diagnosis not present

## 2023-11-04 NOTE — Progress Notes (Deleted)
 NEUROLOGY FOLLOW UP OFFICE NOTE  Betty Dean [id number]  Assessment/Plan:   Migraine with and without aura, without status migrainosus, not intractable. History of left basal ganglia infarct - likely small vessel disease Hypertension Tobacco use disorder Hyperlipidemia   Migraine prevention:  Ajovy  *** Migraine rescue:  Ubrelvy  100mg .  *** Secondary stroke prevention as managed by PCP: ASA 81mg  daily Statin.  LDL goal less than 70.   Normotensive blood pressure Hgb A1c goal less than 7 Smoking cessation Mediterranean diet Routine excercise Follow up ***       Subjective:  Betty Dean is a 33 year old right-handed female with HTN, tobacco abuse, depression, anxiety, migraine, PCOS and history of stomach ulcer who follows up for stroke and migraine.  UPDATE: *** Intensity:  *** Duration:  15-30 minutes with Ubrelvy  Frequency:  ***   Current migraine rescue:  Tylenol    Current migraine preventative:  none Current muscle relaxant:  Flexeril Current CGRP inhibitor:  Ajovy , Ubrelvy  100mg  Other medications:  ASA 81mg  daily, rosuvastatin  40mg , amlodipine , metformin     HISTORY: Stroke: On 11/11/2021, she developed word-finding difficulty, could not write her name and had difficulty with simple tasks such as typing on her phone.  Also noticed some right arm and hand weakness.  Some difficulty with coordination of her right foot and hand.  No associated headache, dizziness, visual disturbance, unilateral numbness or weakness.  She has history of classic migraines which are manageable.  Denies recent head trauma.  For the previous month, she had been experiencing dizziness and palpitations.  She was evaluated by cardiology and underwent a 4 week cardiac event monitor which revealed 4 PVCs with 1 morphologies but no a fib, AV block or pauses or other significant arrhythmias.  She went to [facility] where she was admitted.  Blood pressure in the ED was 205/112.  CT  perfusion was negative but CT head demonstrated decreased density in the left basal ganglia.  CTA of head and neck revealed  no LVO, hemodynamically significant stenosis or dissection.  MRI of brain confirmed a moderate lacunar infarct within the left basal ganglia.  Echo with bubble study showed EF 55-60% with no PFO.  She was discharged on dual antiplatelet therapy (ASA 81mg  and Plavix) followed by ASA alone.  She was also started on Crestor .  Hypercoagulable panel on 12/11/2021 was normal.      Migraine:  She also has migraines.  Migraines are severe bilateral retro-orbital pounding pain with nausea, vomiting, photophobia, phonophobia and sometimes with blurred vision or sees stars.  Usually lasts 1-2 days and occurs once a month.    Past migraine rescue:  Fioricet, rizatriptan  (Triptans contraindicated) Past migraine prevention:  topiramate , propranolol   Had hysterectomy in February 2025.   Testing: CT PERFUSION W:  No evidence of ischemia in the sampled regions of the brain CT HEAD WO:  1. No acute cortical stroke. 2. Small region of decreased density in the left basal ganglia and extending into the left periventricular white matter. This may represent lacune or can be seen with demyelinating process. MRI BRAIN WO:  Moderate acute lacunar infarct left basal ganglia to corona radiata. Otherwise, relatively normal appearance of the brain. CTA HEAD & NECK:  No evidence of dissection, occlusion, aneurysm, or hemodynamically significant stenosis of the arteries in the head/neck. ECHO WITH BUBBLE STUDY:  1. Left ventricle: The cavity size is normal. Systolic function is normal. The estimated ejection fraction is 55-60%. Wall motion is normal; there are no regional  wall motion abnormalities. Grade I diastolic dysfunction. 2. Right ventricle: The cavity size is normal. Systolic function is normal. 3. No significant valve disease is identified. 4. Atrial septum: No defect or patent foramen ovale is  identified. Negative bubble study.  30 DAY CARDIAC EVENT MONITOR:  The patient was monitored for a total of 27d 20h, underlying rhythm is Sinus. The minimum heart rate was 46 bpm; the maximum 143 bpm; the average 76 bpm. No A-fib, AV block, pauses, VT or VF identified. There were a total of 4 PVCs with 1 morphologies and 0 couplets. Overall PVC Burden at < 0.01 % There were a total of 0 Other Beats. There were 0 total number of paced beats. There were a total of 2070 PSVCs with 1 morphologies and 0 couplets. Overall PSVC Burden at 0.07 % There is a total of 6 patient events: Dizziness/near syncope associated with normal sinus rhythm, rapid/fast heartbeat associated with mild sinus tachycardia, palpitations/skipping associated with mild sinus tachycardia. Benign-appearing cardiac event monitor.  PAST MEDICAL HISTORY: Past Medical History:  Diagnosis Date   Anxiety and depression    GERD (gastroesophageal reflux disease)    History of CVA (cerebrovascular accident)    L basal ganglia 10/2021  Echo normal.   Hypercholesterolemia    Hypertension    Migraine    maybe once/wk (10/15/2014)   Palpitations    Event monitor 2023/2024 benign   Peptic ulcer disease    Polycystic ovarian syndrome    Prediabetes    06/2022 a1c 6.4%-->metformin  started   Recurrent UTI    Vitamin D  deficiency     MEDICATIONS: Current Outpatient Medications on File Prior to Visit  Medication Sig Dispense Refill   acetaminophen  (TYLENOL ) 325 MG tablet Take 650 mg by mouth every 6 (six) hours as needed for mild pain, moderate pain or headache.      amLODipine  (NORVASC ) 10 MG tablet Take 1 tablet (10 mg total) by mouth daily. 90 tablet 1   ASPIRIN 81 PO Take by mouth.     ciprofloxacin  (CIPRO ) 500 MG tablet Take 1 tablet (500 mg total) by mouth 2 (two) times daily. 10 tablet 0   cyclobenzaprine (FLEXERIL) 10 MG tablet Take 10 mg by mouth 2 (two) times daily as needed.     escitalopram (LEXAPRO) 20 MG tablet Take 20 mg by  mouth daily.     Fremanezumab -vfrm (AJOVY ) 225 MG/1.5ML SOAJ Inject 225 mg into the skin every 28 (twenty-eight) days. 1.68 mL 11   LORazepam  (ATIVAN ) 0.5 MG tablet TAKE 1 TABLET BY MOUTH TWICE A DAY AS NEEDED 30 tablet 1   metFORMIN  (GLUCOPHAGE ) 500 MG tablet Take 1 tablet (500 mg total) by mouth 2 (two) times daily with a meal. 180 tablet 1   phentermine  37.5 MG capsule Take 1 capsule (37.5 mg total) by mouth every morning. 90 capsule 1   rosuvastatin  (CRESTOR ) 40 MG tablet Take 1 tablet (40 mg total) by mouth daily. 90 tablet 1   Ubrogepant  (UBRELVY ) 100 MG TABS Take 1 tablet (100 mg total) by mouth as needed. May repeat after 2 hours.  Maximum 2 tablets in 24 hours. 16 tablet 11   Vitamin D -Vitamin K (VITAMIN K2-VITAMIN D3 PO) Take by mouth daily. 10,000 iu + 120mcg     No current facility-administered medications on file prior to visit.    ALLERGIES: Allergies  Allergen Reactions   Latex Itching, Rash and Hives   Shellfish Allergy Itching and Swelling    Rash / Swelling  Rash /  Swelling Rash / Swelling    FAMILY HISTORY: Family History  Problem Relation Age of Onset   Migraines Mother    Depression Mother    Diabetes Mother    Hyperlipidemia Mother    Hypertension Mother    Mental illness Mother    Alcohol abuse Father    Seizures Brother    Multiple sclerosis Maternal Grandmother    Migraines Maternal Grandmother    Arthritis Maternal Grandmother    Depression Maternal Grandmother    Early death Maternal Grandmother    Hyperlipidemia Maternal Grandmother    Hypertension Maternal Grandmother    Kidney disease Maternal Grandmother    Mental illness Maternal Grandmother    Heart attack Maternal Grandfather    Heart disease Maternal Grandfather    Migraines Maternal Aunt    Depression Daughter    Learning disabilities Daughter    Mental illness Daughter    Asthma Son    Birth defects Son    Learning disabilities Son       Objective:  *** General: No acute  distress.  Patient appears ***-groomed.   Head:  Normocephalic/atraumatic Eyes:  Fundi examined but not visualized Neck: supple, no paraspinal tenderness, full range of motion Heart:  Regular rate and rhythm Neurological Exam: alert and oriented.  Speech fluent and not dysarthric, language intact.  CN II-XII intact. Bulk and tone normal, muscle strength 5/5 throughout.  Sensation to light touch intact.  Deep tendon reflexes 2+ throughout, toes downgoing.  Finger to nose testing intact.  Gait normal, Romberg negative.   Juliene Dunnings, DO  CC: ***

## 2023-11-05 ENCOUNTER — Encounter: Payer: Self-pay | Admitting: Neurology

## 2023-11-05 ENCOUNTER — Ambulatory Visit: Admitting: Neurology

## 2023-11-08 ENCOUNTER — Other Ambulatory Visit: Payer: Self-pay | Admitting: Medical Genetics

## 2023-11-08 ENCOUNTER — Ambulatory Visit: Admitting: Family Medicine

## 2023-11-08 ENCOUNTER — Encounter: Payer: Self-pay | Admitting: Family Medicine

## 2023-11-08 VITALS — BP 122/75 | HR 86 | Temp 98.2°F | Ht 65.0 in | Wt 213.2 lb

## 2023-11-08 DIAGNOSIS — J029 Acute pharyngitis, unspecified: Secondary | ICD-10-CM | POA: Diagnosis not present

## 2023-11-08 MED ORDER — AMOXICILLIN 875 MG PO TABS: 875.0000 mg | ORAL_TABLET | Freq: Two times a day (BID) | ORAL | 0 refills | Status: AC

## 2023-11-08 NOTE — Progress Notes (Signed)
 OFFICE VISIT  11/08/2023  CC:  Chief Complaint  Patient presents with   Ear Concern    Right ear pain and sore throat x 2 weeks; went to UC on Sat; tested neg for covid and strep   Patient is a 33 y.o. female who presents for ear and swallowing concerns.  HPI: Approximately 2-week history of sore throat and ear pain. Some slight runny nose and nasal congestion but nothing too significant.  No cough or fever. She does feel tired. No body aches or rash. No headache.  She went to urgent care about 6 days ago and strep was negative.  She was told to take over-the-counter allergy medication.  These medications have not helped.  PMP AWARE reviewed today: most recent rx for phentermine  was filled 04/15/2023, # 30, rx by me.   Most recent lorazepam  prescription was filled 04/12/2023, #30, prescription by me. No red flags.   Past Medical History:  Diagnosis Date   Anxiety and depression    GERD (gastroesophageal reflux disease)    History of CVA (cerebrovascular accident)    L basal ganglia 10/2021  Echo normal.   Hypercholesterolemia    Hypertension    Migraine    maybe once/wk (10/15/2014)   Palpitations    Event monitor 2023/2024 benign   Peptic ulcer disease    Polycystic ovarian syndrome    Prediabetes    06/2022 a1c 6.4%-->metformin  started   Recurrent UTI    Vitamin D  deficiency     Past Surgical History:  Procedure Laterality Date   BLADDER REPAIR     CESAREAN SECTION  2021   CHOLECYSTECTOMY  2022   CYSTOSCOPY  2022   TUBAL LIGATION Bilateral 2021    Outpatient Medications Prior to Visit  Medication Sig Dispense Refill   acetaminophen  (TYLENOL ) 325 MG tablet Take 650 mg by mouth every 6 (six) hours as needed for mild pain, moderate pain or headache.      amLODipine  (NORVASC ) 10 MG tablet Take 1 tablet (10 mg total) by mouth daily. 90 tablet 1   ASPIRIN 81 PO Take by mouth.     cyclobenzaprine (FLEXERIL) 10 MG tablet Take 10 mg by mouth 2 (two) times daily as  needed.     escitalopram (LEXAPRO) 20 MG tablet Take 20 mg by mouth daily.     Fremanezumab -vfrm (AJOVY ) 225 MG/1.5ML SOAJ Inject 225 mg into the skin every 28 (twenty-eight) days. 1.68 mL 11   LORazepam  (ATIVAN ) 0.5 MG tablet TAKE 1 TABLET BY MOUTH TWICE A DAY AS NEEDED 30 tablet 1   metFORMIN  (GLUCOPHAGE ) 500 MG tablet Take 1 tablet (500 mg total) by mouth 2 (two) times daily with a meal. 180 tablet 1   phentermine  37.5 MG capsule Take 1 capsule (37.5 mg total) by mouth every morning. 90 capsule 1   rosuvastatin  (CRESTOR ) 40 MG tablet Take 1 tablet (40 mg total) by mouth daily. 90 tablet 1   Ubrogepant  (UBRELVY ) 100 MG TABS Take 1 tablet (100 mg total) by mouth as needed. May repeat after 2 hours.  Maximum 2 tablets in 24 hours. 16 tablet 11   Vitamin D -Vitamin K (VITAMIN K2-VITAMIN D3 PO) Take by mouth daily. 10,000 iu + 120mcg     ciprofloxacin  (CIPRO ) 500 MG tablet Take 1 tablet (500 mg total) by mouth 2 (two) times daily. 10 tablet 0   No facility-administered medications prior to visit.    Allergies  Allergen Reactions   Latex Itching, Rash and Hives   Shellfish Allergy  Itching and Swelling    Rash / Swelling  Rash / Swelling Rash / Swelling    Review of Systems  As per HPI  PE:    11/08/2023   10:44 AM 06/27/2023    8:45 AM 04/12/2023    1:11 PM  Vitals with BMI  Height 5' 5 5' 5 5' 5  Weight 213 lbs 3 oz 207 lbs 3 oz 194 lbs  BMI 35.48 34.48 32.28  Systolic 122 136 869  Diastolic 75 91 86  Pulse 86 78 80     Physical Exam  VS: noted--normal. Gen: alert, NAD, NONTOXIC APPEARING. HEENT: eyes without injection, drainage, or swelling.  Ears: EACs clear, TMs with normal light reflex and landmarks.  Nose: Clear rhinorrhea, with some dried, crusty exudate adherent to mildly injected mucosa.  No purulent d/c.  No paranasal sinus TTP.  No facial swelling.  Throat and mouth without focal lesion.  No pharyngial swelling, erythema, or exudate.   Neck: supple, slight  bilateral anterior cervical lymphadenopathy that is mildly tender.  No posterior cervical lymphadenopathy is palpable. LUNGS: CTA bilat, nonlabored resps.   CV: RRR, no m/r/g. EXT: no c/c/e SKIN: no rash   LABS:  Last CBC Lab Results  Component Value Date   WBC 11.5 (H) 06/21/2022   HGB 12.7 06/21/2022   HCT 38.4 06/21/2022   MCV 85.9 06/21/2022   MCH 27.7 07/15/2021   RDW 13.9 06/21/2022   PLT 204.0 06/21/2022   Last metabolic panel Lab Results  Component Value Date   GLUCOSE 109 (H) 10/05/2022   NA 138 10/05/2022   K 3.8 10/05/2022   CL 99 10/05/2022   CO2 32 10/05/2022   BUN 16 10/05/2022   CREATININE 0.82 10/05/2022   GFR 94.14 10/05/2022   CALCIUM  10.0 10/05/2022   PHOS 2.7 10/15/2014   PROT 6.6 06/21/2022   ALBUMIN 4.1 06/21/2022   BILITOT 0.3 06/21/2022   ALKPHOS 81 06/21/2022   AST 15 06/21/2022   ALT 21 06/21/2022   ANIONGAP 8 07/15/2021   Last hemoglobin A1c Lab Results  Component Value Date   HGBA1C 6.0 (A) 04/12/2023   HGBA1C 6.0 04/12/2023   HGBA1C 6.0 04/12/2023   HGBA1C 6.0 04/12/2023   IMPRESSION AND PLAN:  Prolonged pharyngitis.  Her ear pain could be referred pain. Her exam is normal today. Suspect viral syndrome such as mono. Rapid flu and strep today are both NEG. We will go ahead and treat with 10 days of amoxicillin  for the possibility of non-group A strep infection.  An After Visit Summary was printed and given to the patient.  FOLLOW UP: Return if symptoms worsen or fail to improve.  Signed:  Gerlene Hockey, MD           11/08/2023

## 2023-11-21 ENCOUNTER — Ambulatory Visit: Admitting: Family Medicine

## 2023-11-21 NOTE — Progress Notes (Deleted)
 OFFICE VISIT  11/21/2023  CC: No chief complaint on file.   Patient is a 33 y.o. female who presents for ongoing sore throat. I saw her on 11/08/2023. A/P as of that visit: Prolonged pharyngitis.  Her ear pain could be referred pain. Her exam is normal today. Suspect viral syndrome such as mono. Rapid flu and strep today are both NEG. We will go ahead and treat with 10 days of amoxicillin  for the possibility of non-group A strep infection.  HPI: ***   Past Medical History:  Diagnosis Date   Anxiety and depression    GERD (gastroesophageal reflux disease)    History of CVA (cerebrovascular accident)    L basal ganglia 10/2021  Echo normal.   Hypercholesterolemia    Hypertension    Migraine    maybe once/wk (10/15/2014)   Palpitations    Event monitor 2023/2024 benign   Peptic ulcer disease    Polycystic ovarian syndrome    Prediabetes    06/2022 a1c 6.4%-->metformin  started   Recurrent UTI    Vitamin D  deficiency     Past Surgical History:  Procedure Laterality Date   BLADDER REPAIR     CESAREAN SECTION  2021   CHOLECYSTECTOMY  2022   CYSTOSCOPY  2022   TUBAL LIGATION Bilateral 2021    Outpatient Medications Prior to Visit  Medication Sig Dispense Refill   acetaminophen  (TYLENOL ) 325 MG tablet Take 650 mg by mouth every 6 (six) hours as needed for mild pain, moderate pain or headache.      amLODipine  (NORVASC ) 10 MG tablet Take 1 tablet (10 mg total) by mouth daily. 90 tablet 1   ASPIRIN 81 PO Take by mouth.     cyclobenzaprine (FLEXERIL) 10 MG tablet Take 10 mg by mouth 2 (two) times daily as needed.     escitalopram (LEXAPRO) 20 MG tablet Take 20 mg by mouth daily.     Fremanezumab -vfrm (AJOVY ) 225 MG/1.5ML SOAJ Inject 225 mg into the skin every 28 (twenty-eight) days. 1.68 mL 11   LORazepam  (ATIVAN ) 0.5 MG tablet TAKE 1 TABLET BY MOUTH TWICE A DAY AS NEEDED 30 tablet 1   metFORMIN  (GLUCOPHAGE ) 500 MG tablet Take 1 tablet (500 mg total) by mouth 2 (two) times  daily with a meal. 180 tablet 1   phentermine  37.5 MG capsule Take 1 capsule (37.5 mg total) by mouth every morning. 90 capsule 1   rosuvastatin  (CRESTOR ) 40 MG tablet Take 1 tablet (40 mg total) by mouth daily. 90 tablet 1   Ubrogepant  (UBRELVY ) 100 MG TABS Take 1 tablet (100 mg total) by mouth as needed. May repeat after 2 hours.  Maximum 2 tablets in 24 hours. 16 tablet 11   Vitamin D -Vitamin K (VITAMIN K2-VITAMIN D3 PO) Take by mouth daily. 10,000 iu + 120mcg     No facility-administered medications prior to visit.    Allergies  Allergen Reactions   Latex Itching, Rash and Hives   Shellfish Allergy Itching and Swelling    Rash / Swelling  Rash / Swelling Rash / Swelling    Review of Systems  As per HPI  PE:    11/08/2023   10:44 AM 06/27/2023    8:45 AM 04/12/2023    1:11 PM  Vitals with BMI  Height 5' 5 5' 5 5' 5  Weight 213 lbs 3 oz 207 lbs 3 oz 194 lbs  BMI 35.48 34.48 32.28  Systolic 122 136 869  Diastolic 75 91 86  Pulse 86 78 80  Physical Exam  ***  LABS:  Last metabolic panel Lab Results  Component Value Date   GLUCOSE 109 (H) 10/05/2022   NA 138 10/05/2022   K 3.8 10/05/2022   CL 99 10/05/2022   CO2 32 10/05/2022   BUN 16 10/05/2022   CREATININE 0.82 10/05/2022   GFR 94.14 10/05/2022   CALCIUM  10.0 10/05/2022   PHOS 2.7 10/15/2014   PROT 6.6 06/21/2022   ALBUMIN 4.1 06/21/2022   BILITOT 0.3 06/21/2022   ALKPHOS 81 06/21/2022   AST 15 06/21/2022   ALT 21 06/21/2022   ANIONGAP 8 07/15/2021   IMPRESSION AND PLAN:  No problem-specific Assessment & Plan notes found for this encounter.   An After Visit Summary was printed and given to the patient.  FOLLOW UP: No follow-ups on file.  Signed:  Gerlene Hockey, MD           11/21/2023

## 2023-12-30 ENCOUNTER — Ambulatory Visit: Admitting: Family Medicine

## 2023-12-30 NOTE — Progress Notes (Deleted)
 OFFICE VISIT  12/30/2023  CC: No chief complaint on file.   Patient is a 33 y.o. female who presents for back pain.  HPI: ***  Past Medical History:  Diagnosis Date   Anxiety and depression    GERD (gastroesophageal reflux disease)    History of CVA (cerebrovascular accident)    L basal ganglia 10/2021  Echo normal.   Hypercholesterolemia    Hypertension    Migraine    maybe once/wk (10/15/2014)   Palpitations    Event monitor 2023/2024 benign   Peptic ulcer disease    Polycystic ovarian syndrome    Prediabetes    06/2022 a1c 6.4%-->metformin  started   Recurrent UTI    Vitamin D  deficiency     Past Surgical History:  Procedure Laterality Date   BLADDER REPAIR     CESAREAN SECTION  2021   CHOLECYSTECTOMY  2022   CYSTOSCOPY  2022   TUBAL LIGATION Bilateral 2021    Outpatient Medications Prior to Visit  Medication Sig Dispense Refill   acetaminophen  (TYLENOL ) 325 MG tablet Take 650 mg by mouth every 6 (six) hours as needed for mild pain, moderate pain or headache.      amLODipine  (NORVASC ) 10 MG tablet Take 1 tablet (10 mg total) by mouth daily. 90 tablet 1   ASPIRIN 81 PO Take by mouth.     cyclobenzaprine (FLEXERIL) 10 MG tablet Take 10 mg by mouth 2 (two) times daily as needed.     escitalopram (LEXAPRO) 20 MG tablet Take 20 mg by mouth daily.     Fremanezumab -vfrm (AJOVY ) 225 MG/1.5ML SOAJ Inject 225 mg into the skin every 28 (twenty-eight) days. 1.68 mL 11   LORazepam  (ATIVAN ) 0.5 MG tablet TAKE 1 TABLET BY MOUTH TWICE A DAY AS NEEDED 30 tablet 1   metFORMIN  (GLUCOPHAGE ) 500 MG tablet Take 1 tablet (500 mg total) by mouth 2 (two) times daily with a meal. 180 tablet 1   phentermine  37.5 MG capsule Take 1 capsule (37.5 mg total) by mouth every morning. 90 capsule 1   rosuvastatin  (CRESTOR ) 40 MG tablet Take 1 tablet (40 mg total) by mouth daily. 90 tablet 1   Ubrogepant  (UBRELVY ) 100 MG TABS Take 1 tablet (100 mg total) by mouth as needed. May repeat after 2 hours.   Maximum 2 tablets in 24 hours. 16 tablet 11   Vitamin D -Vitamin K (VITAMIN K2-VITAMIN D3 PO) Take by mouth daily. 10,000 iu + 120mcg     No facility-administered medications prior to visit.    Allergies  Allergen Reactions   Latex Itching, Rash and Hives   Shellfish Allergy Itching and Swelling    Rash / Swelling  Rash / Swelling Rash / Swelling    Review of Systems  As per HPI  PE:    11/08/2023   10:44 AM 06/27/2023    8:45 AM 04/12/2023    1:11 PM  Vitals with BMI  Height 5' 5 5' 5 5' 5  Weight 213 lbs 3 oz 207 lbs 3 oz 194 lbs  BMI 35.48 34.48 32.28  Systolic 122 136 869  Diastolic 75 91 86  Pulse 86 78 80     Physical Exam  ***  LABS:  Last CBC Lab Results  Component Value Date   WBC 11.5 (H) 06/21/2022   HGB 12.7 06/21/2022   HCT 38.4 06/21/2022   MCV 85.9 06/21/2022   MCH 27.7 07/15/2021   RDW 13.9 06/21/2022   PLT 204.0 06/21/2022   Last metabolic panel  Lab Results  Component Value Date   GLUCOSE 109 (H) 10/05/2022   NA 138 10/05/2022   K 3.8 10/05/2022   CL 99 10/05/2022   CO2 32 10/05/2022   BUN 16 10/05/2022   CREATININE 0.82 10/05/2022   GFR 94.14 10/05/2022   CALCIUM  10.0 10/05/2022   PHOS 2.7 10/15/2014   PROT 6.6 06/21/2022   ALBUMIN 4.1 06/21/2022   BILITOT 0.3 06/21/2022   ALKPHOS 81 06/21/2022   AST 15 06/21/2022   ALT 21 06/21/2022   ANIONGAP 8 07/15/2021   Last hemoglobin A1c Lab Results  Component Value Date   HGBA1C 6.0 (A) 04/12/2023   HGBA1C 6.0 04/12/2023   HGBA1C 6.0 04/12/2023   HGBA1C 6.0 04/12/2023   IMPRESSION AND PLAN:  No problem-specific Assessment & Plan notes found for this encounter.   An After Visit Summary was printed and given to the patient.  FOLLOW UP: No follow-ups on file.  Signed:  Gerlene Hockey, MD           12/30/2023

## 2024-01-02 ENCOUNTER — Encounter: Payer: Self-pay | Admitting: Family Medicine

## 2024-01-31 ENCOUNTER — Other Ambulatory Visit (HOSPITAL_COMMUNITY)

## 2024-02-17 ENCOUNTER — Other Ambulatory Visit: Payer: Self-pay | Admitting: Family Medicine

## 2024-02-19 ENCOUNTER — Other Ambulatory Visit: Payer: Self-pay | Admitting: Family Medicine

## 2024-04-30 IMAGING — CT CT RENAL STONE PROTOCOL
2 of 4 series · 16 of 46 positions shown, 18 images · non-contrast
Comparison: 10/15/2014

CLINICAL DATA: Bilateral flank pain



[Series 2: stone full · axial · 0.68mm/px · z∈[-487,-67]mm · 13 of 92 slices shown, 15 images]
[im 4/92  soft-tissue]
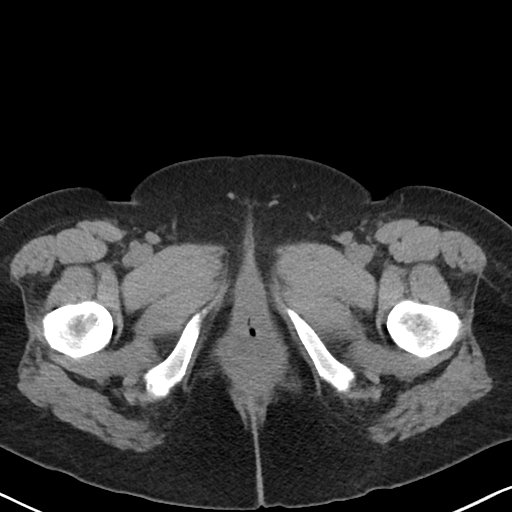
[im 4/92  bone]
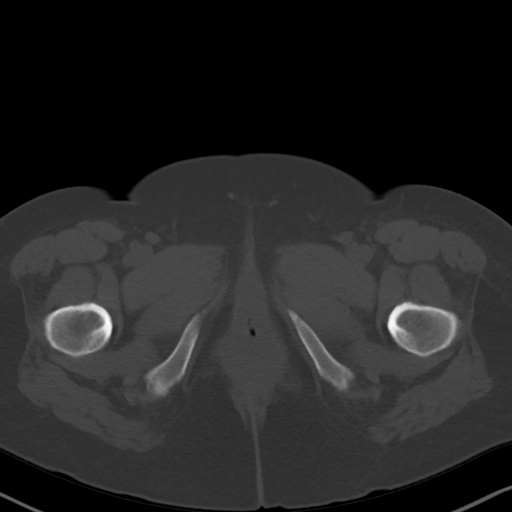
[im 12/92  soft-tissue]
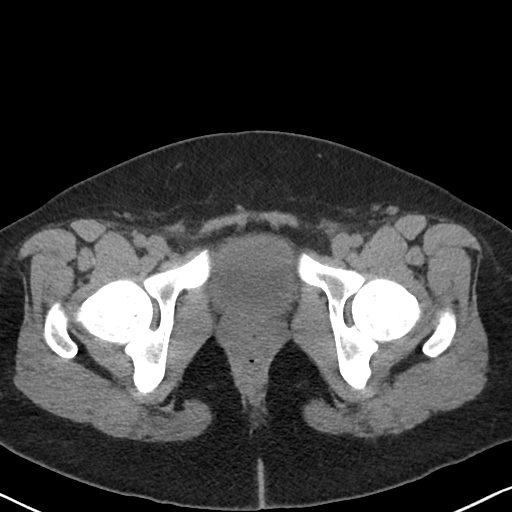
[im 19/92  soft-tissue]
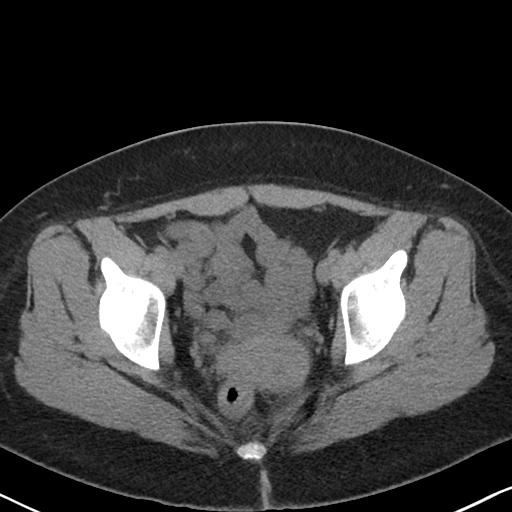
[im 27/92  soft-tissue]
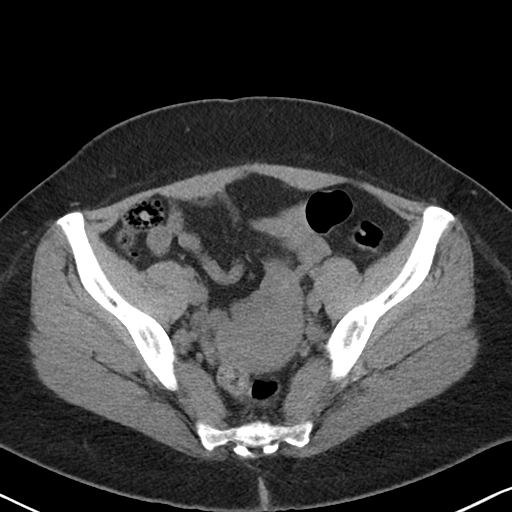
[im 31/92  soft-tissue]
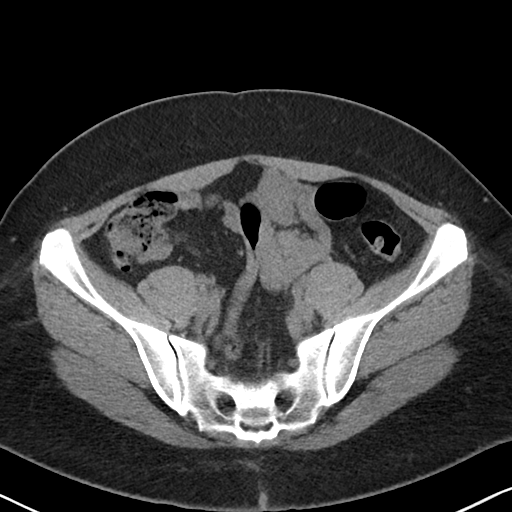
[im 38/92  soft-tissue]
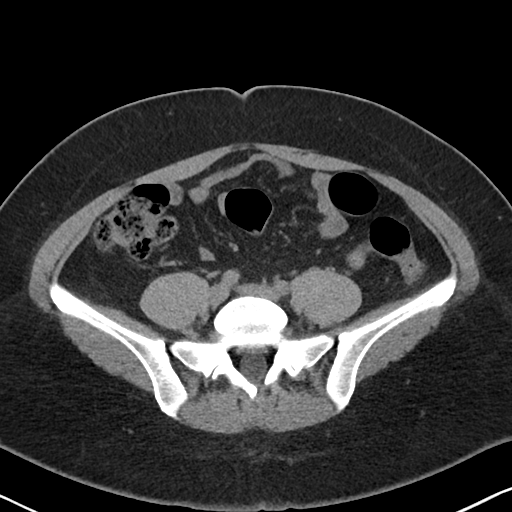
[im 46/92  soft-tissue]
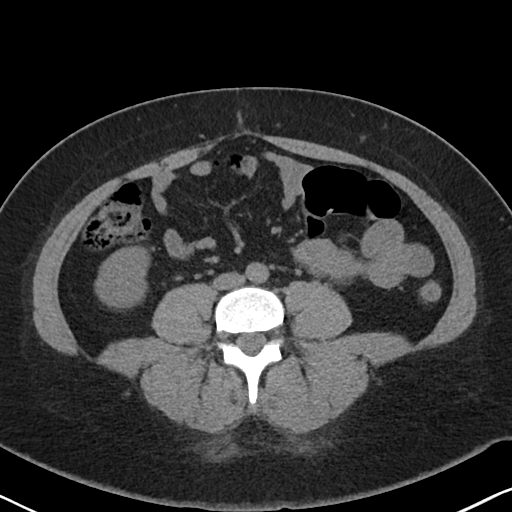
[im 54/92  soft-tissue]
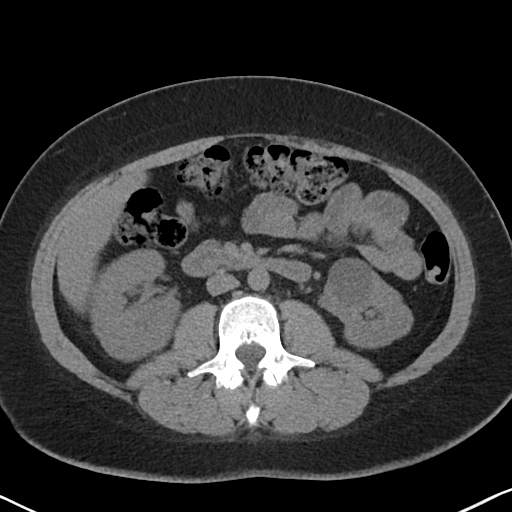
[im 61/92  soft-tissue]
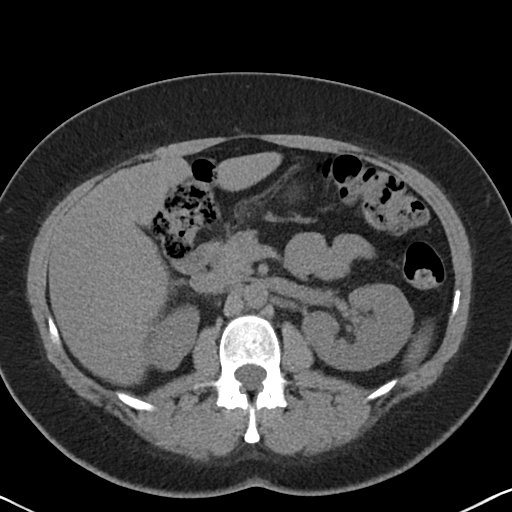
[im 61/92  bone]
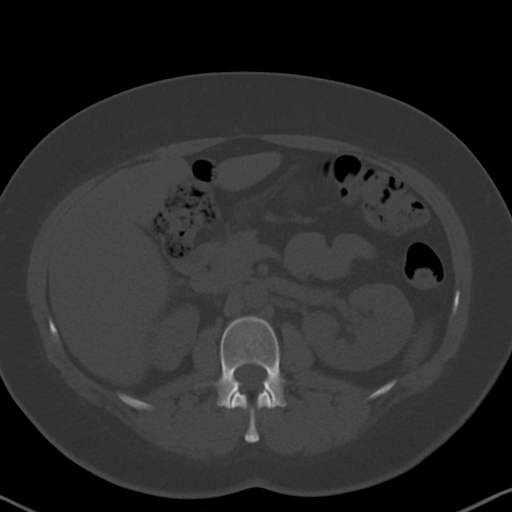
[im 65/92  soft-tissue]
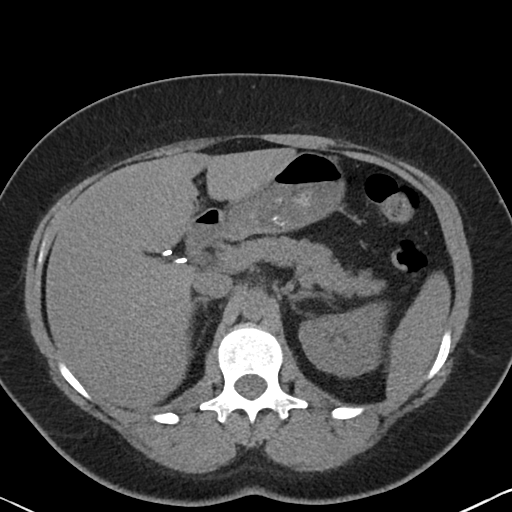
[im 73/92  soft-tissue]
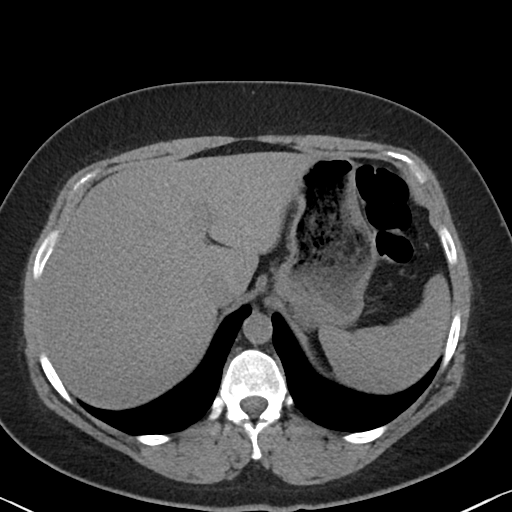
[im 80/92  soft-tissue]
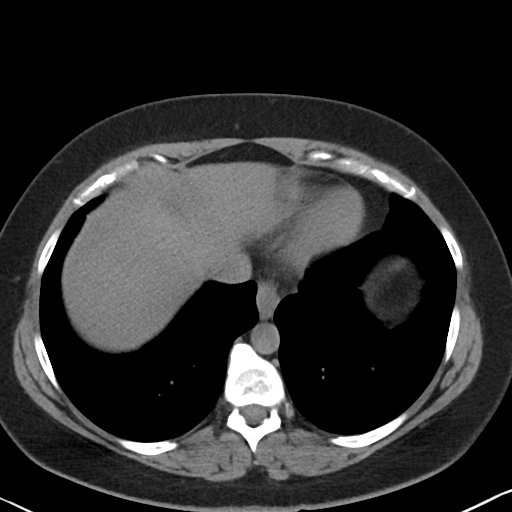
[im 88/92  soft-tissue]
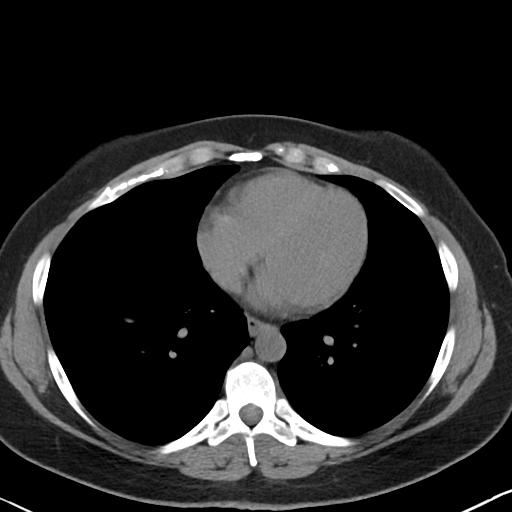

[Series 5: coronal · coronal · 0.69mm/px · 3 of 99 slices shown]
[im 33/99  soft-tissue]
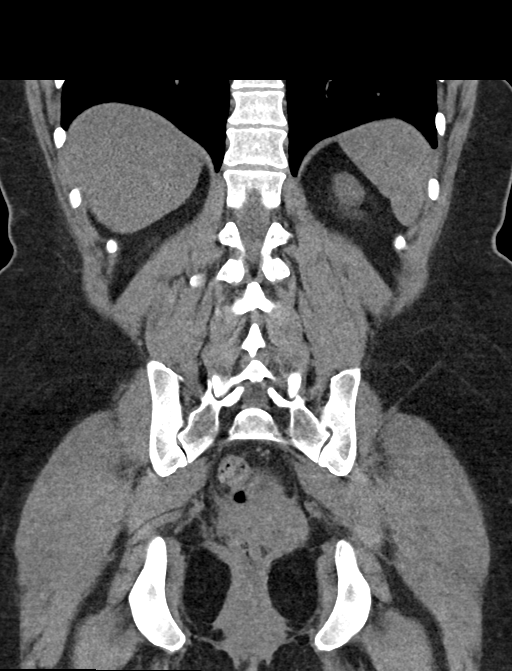
[im 44/99  soft-tissue]
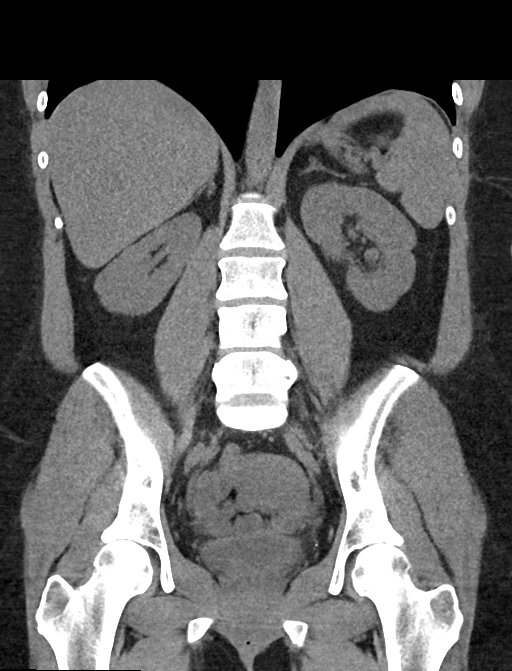
[im 55/99  soft-tissue]
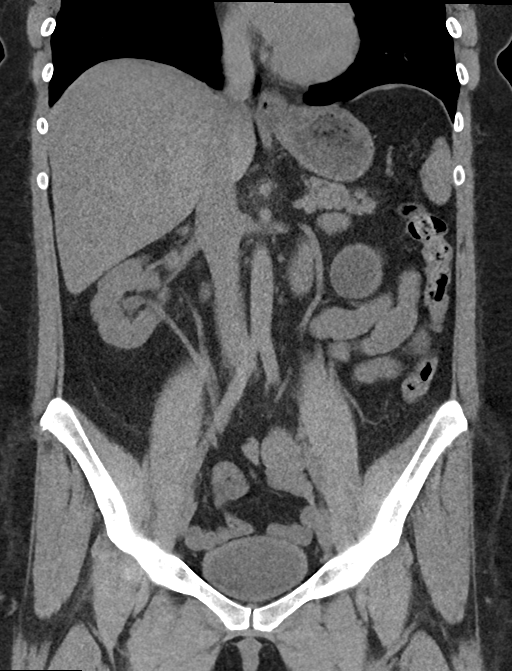

[16 of 46 positions shown; findings below may reference images not displayed]

FINDINGS: Lower chest: No acute abnormality

Hepatobiliary: No focal liver abnormality is seen. Status post
cholecystectomy. No biliary dilatation.

Pancreas: No focal abnormality or ductal dilatation.

Spleen: No focal abnormality.  Normal size.

Adrenals/Urinary Tract: 3.2 cm low-density lesion in the midpole of
the left kidney is increased from 1.2 cm previously. This cannot be
characterized on this noncontrast study. No stones or
hydronephrosis. Adrenal glands and urinary bladder unremarkable.

Stomach/Bowel: Normal appendix. Stomach, large and small bowel
grossly unremarkable.

Vascular/Lymphatic: No evidence of aneurysm or adenopathy.

Reproductive: Uterus and adnexa unremarkable.  No mass.

Other: No free fluid or free air.

Musculoskeletal: No acute bony abnormality.
IMPRESSION: No renal or ureteral stones.  No hydronephrosis.

Enlarging low-density lesion in the midpole of the left kidney,
cm compared to 1.2 cm in 1151. This most likely reflects cysts but
cannot be characterized on this noncontrast study. This could be
further evaluated with renal ultrasound.
